# Patient Record
Sex: Male | Born: 1955 | Race: Black or African American | Hispanic: No | Marital: Married | Smoking: Never smoker
Health system: Southern US, Community
[De-identification: ages and names within clinical notes are randomized; demographics above are authoritative.]

## PROBLEM LIST (undated history)

## (undated) DIAGNOSIS — I82409 Acute embolism and thrombosis of unspecified deep veins of unspecified lower extremity: Secondary | ICD-10-CM

## (undated) DIAGNOSIS — Z8619 Personal history of other infectious and parasitic diseases: Secondary | ICD-10-CM

## (undated) HISTORY — DX: Personal history of other infectious and parasitic diseases: Z86.19

## (undated) HISTORY — PX: OTHER SURGICAL HISTORY: SHX169

---

## 2003-11-26 ENCOUNTER — Inpatient Hospital Stay (HOSPITAL_COMMUNITY): Admission: EM | Admit: 2003-11-26 | Discharge: 2003-11-30 | Payer: Self-pay

## 2011-08-15 ENCOUNTER — Encounter: Payer: Self-pay | Admitting: Internal Medicine

## 2011-08-15 ENCOUNTER — Ambulatory Visit (INDEPENDENT_AMBULATORY_CARE_PROVIDER_SITE_OTHER): Payer: BC Managed Care – PPO | Admitting: Internal Medicine

## 2011-08-15 VITALS — BP 110/80 | HR 70 | Temp 98.7°F | Resp 16 | Ht 74.0 in | Wt 206.0 lb

## 2011-08-15 DIAGNOSIS — Z23 Encounter for immunization: Secondary | ICD-10-CM

## 2011-08-15 DIAGNOSIS — Z412 Encounter for routine and ritual male circumcision: Secondary | ICD-10-CM

## 2011-08-15 DIAGNOSIS — Z0000 Encounter for general adult medical examination without abnormal findings: Secondary | ICD-10-CM

## 2011-08-15 LAB — CBC WITH DIFFERENTIAL/PLATELET
Basophils Absolute: 0 10*3/uL (ref 0.0–0.1)
Eosinophils Absolute: 0.1 10*3/uL (ref 0.0–0.7)
Hemoglobin: 12.3 g/dL — ABNORMAL LOW (ref 13.0–17.0)
Lymphocytes Relative: 39.8 % (ref 12.0–46.0)
MCHC: 32 g/dL (ref 30.0–36.0)
MCV: 94.7 fl (ref 78.0–100.0)
Monocytes Absolute: 0.4 10*3/uL (ref 0.1–1.0)
Monocytes Relative: 12.2 % — ABNORMAL HIGH (ref 3.0–12.0)
Platelets: 120 10*3/uL — ABNORMAL LOW (ref 150.0–400.0)
RBC: 4.06 Mil/uL — ABNORMAL LOW (ref 4.22–5.81)
WBC: 3 10*3/uL — ABNORMAL LOW (ref 4.5–10.5)

## 2011-08-15 LAB — COMPREHENSIVE METABOLIC PANEL
Alkaline Phosphatase: 56 U/L (ref 39–117)
CO2: 26 mEq/L (ref 19–32)
Calcium: 8.9 mg/dL (ref 8.4–10.5)
GFR: 78.95 mL/min (ref >60.00)
Sodium: 141 mEq/L (ref 135–145)
Total Bilirubin: 0.5 mg/dL (ref 0.3–1.2)

## 2011-08-15 LAB — LIPID PANEL
Cholesterol: 150 mg/dL (ref 0–200)
LDL Cholesterol: 93 mg/dL (ref 0–99)
Total CHOL/HDL Ratio: 3
Triglycerides: 55 mg/dL (ref 0.0–149.0)

## 2011-08-15 NOTE — Progress Notes (Signed)
  Subjective:    Patient ID: Troy Gonzalez, male    DOB: August 13, 1956, 55 y.o.   MRN: [id number]  HPI 54 year old patient who is seen today to establish with our practice. He has enjoyed remarkably good health and has had no prior hospital admissions. He has no chronic illnesses and takes no chronic medications. He wishes a urologic referral for consideration of a circumcision. No prior colonoscopies  Family history father died in his 35s unclear cause Mother died age 63 apparently surgical complications of a total hip replacement she died during surgery not postop; history of asthma 2 brothers deceased one an accidental death one died age 76 unclear causes 5 sisters positive for chronic kidney disease    Review of Systems  Constitutional: Negative for fever, chills, activity change, appetite change and fatigue.  HENT: Negative for hearing loss, ear pain, congestion, rhinorrhea, sneezing, mouth sores, trouble swallowing, neck pain, neck stiffness, dental problem, voice change, sinus pressure and tinnitus.   Eyes: Negative for photophobia, pain, redness and visual disturbance.  Respiratory: Negative for apnea, cough, choking, chest tightness, shortness of breath and wheezing.   Cardiovascular: Negative for chest pain, palpitations and leg swelling.  Gastrointestinal: Negative for nausea, vomiting, abdominal pain, diarrhea, constipation, blood in stool, abdominal distention, anal bleeding and rectal pain.  Genitourinary: Positive for difficulty urinating. Negative for dysuria, urgency, frequency, hematuria, flank pain, decreased urine volume, discharge, penile swelling, scrotal swelling, genital sores and testicular pain.  Musculoskeletal: Negative for myalgias, back pain, joint swelling, arthralgias and gait problem.  Skin: Negative for color change, rash and wound.  Neurological: Negative for dizziness, tremors, seizures, syncope, facial asymmetry, speech difficulty, weakness,  light-headedness, numbness and headaches.  Hematological: Negative for adenopathy. Does not bruise/bleed easily.  Psychiatric/Behavioral: Negative for suicidal ideas, hallucinations, behavioral problems, confusion, sleep disturbance, self-injury, dysphoric mood, decreased concentration and agitation. The patient is not nervous/anxious.        Objective:   Physical Exam  Constitutional: He appears well-developed and well-nourished.  HENT:  Head: Normocephalic and atraumatic.  Right Ear: External ear normal.  Left Ear: External ear normal.  Nose: Nose normal.  Mouth/Throat: Oropharynx is clear and moist.  Eyes: Conjunctivae and EOM are normal. Pupils are equal, round, and reactive to light. No scleral icterus.  Neck: Normal range of motion. Neck supple. No JVD present. No thyromegaly present.  Cardiovascular: Regular rhythm, normal heart sounds and intact distal pulses.  Exam reveals no gallop and no friction rub.   No murmur heard. Pulmonary/Chest: Effort normal and breath sounds normal. He exhibits no tenderness.  Abdominal: Soft. Bowel sounds are normal. He exhibits no distension and no mass. There is no tenderness.  Genitourinary: Prostate normal and penis normal. Guaiac negative stool.       Uncircumcised  Musculoskeletal: Normal range of motion. He exhibits no edema and no tenderness.  Lymphadenopathy:    He has no cervical adenopathy.  Neurological: He is alert. He has normal reflexes. No cranial nerve deficit. Coordination normal.  Skin: Skin is warm and dry. No rash noted.  Psychiatric: He has a normal mood and affect. His behavior is normal.          Assessment & Plan:   preventive health examination-  will set up for urological evaluation. The patient wishes to consider a circumcision   New screening colonoscopy and updated lab  Recheck one year

## 2011-08-15 NOTE — Patient Instructions (Signed)
Limit your sodium (Salt) intake    It is important that you exercise regularly, at least 20 minutes 3 to 4 times per week.  If you develop chest pain or shortness of breath seek  medical attention.  Return in one year for follow-up  Schedule your colonoscopy to help detect colon cancer. 

## 2011-09-04 ENCOUNTER — Other Ambulatory Visit: Payer: Self-pay | Admitting: Urology

## 2011-09-20 ENCOUNTER — Encounter (HOSPITAL_BASED_OUTPATIENT_CLINIC_OR_DEPARTMENT_OTHER): Payer: Self-pay | Admitting: *Deleted

## 2011-09-20 NOTE — Progress Notes (Signed)
To [facility] at 0730.Hg,Ekg on arrival.npo after mn

## 2011-09-22 ENCOUNTER — Encounter (HOSPITAL_BASED_OUTPATIENT_CLINIC_OR_DEPARTMENT_OTHER)
Admission: RE | Disposition: A | Discharge status: Home / Self Care | Payer: Self-pay | Source: Ambulatory Visit | Attending: Urology

## 2011-09-22 ENCOUNTER — Encounter (HOSPITAL_BASED_OUTPATIENT_CLINIC_OR_DEPARTMENT_OTHER): Payer: Self-pay | Admitting: Anesthesiology

## 2011-09-22 ENCOUNTER — Ambulatory Visit (HOSPITAL_BASED_OUTPATIENT_CLINIC_OR_DEPARTMENT_OTHER): Payer: BC Managed Care – PPO | Admitting: Anesthesiology

## 2011-09-22 ENCOUNTER — Encounter (HOSPITAL_BASED_OUTPATIENT_CLINIC_OR_DEPARTMENT_OTHER): Payer: Self-pay | Admitting: *Deleted

## 2011-09-22 ENCOUNTER — Ambulatory Visit (HOSPITAL_BASED_OUTPATIENT_CLINIC_OR_DEPARTMENT_OTHER)
Admission: RE | Admit: 2011-09-22 | Discharge: 2011-09-22 | Disposition: A | Discharge status: Home / Self Care | Payer: BC Managed Care – PPO | Source: Ambulatory Visit | Attending: Urology | Admitting: Urology

## 2011-09-22 ENCOUNTER — Other Ambulatory Visit: Payer: Self-pay

## 2011-09-22 DIAGNOSIS — N476 Balanoposthitis: Secondary | ICD-10-CM | POA: Insufficient documentation

## 2011-09-22 DIAGNOSIS — N471 Phimosis: Secondary | ICD-10-CM | POA: Insufficient documentation

## 2011-09-22 HISTORY — PX: CIRCUMCISION: SHX1350

## 2011-09-22 LAB — POCT HEMOGLOBIN-HEMACUE: Hemoglobin: 13.7 g/dL (ref 13.0–17.0)

## 2011-09-22 SURGERY — CIRCUMCISION, ADULT
Anesthesia: General | Site: Penis | Wound class: Clean

## 2011-09-22 MED ORDER — PROMETHAZINE HCL 25 MG/ML IJ SOLN: 6.2500 mg | INTRAMUSCULAR | Status: DC | PRN

## 2011-09-22 MED ORDER — DEXAMETHASONE SODIUM PHOSPHATE 4 MG/ML IJ SOLN
INTRAMUSCULAR | Status: DC | PRN
  Administered 2011-09-22: 10 mg via INTRAVENOUS

## 2011-09-22 MED ORDER — ONDANSETRON HCL 4 MG/2ML IJ SOLN
INTRAMUSCULAR | Status: DC | PRN
  Administered 2011-09-22: 4 mg via INTRAVENOUS

## 2011-09-22 MED ORDER — LACTATED RINGERS IV SOLN: INTRAVENOUS | Status: DC

## 2011-09-22 MED ORDER — FENTANYL CITRATE 0.05 MG/ML IJ SOLN
INTRAMUSCULAR | Status: DC | PRN
  Administered 2011-09-22: 75 ug via INTRAVENOUS
  Administered 2011-09-22: 25 ug via INTRAVENOUS

## 2011-09-22 MED ORDER — LIDOCAINE HCL (CARDIAC) 20 MG/ML IV SOLN
INTRAVENOUS | Status: DC | PRN
  Administered 2011-09-22: 100 mg via INTRAVENOUS

## 2011-09-22 MED ORDER — EPHEDRINE SULFATE 50 MG/ML IJ SOLN
INTRAMUSCULAR | Status: DC | PRN
  Administered 2011-09-22 (×2): 5 mg via INTRAVENOUS

## 2011-09-22 MED ORDER — PROPOFOL 10 MG/ML IV EMUL
INTRAVENOUS | Status: DC | PRN
  Administered 2011-09-22: 200 mg via INTRAVENOUS

## 2011-09-22 MED ORDER — MINERAL OIL LIGHT 100 % EX OIL
TOPICAL_OIL | CUTANEOUS | Status: DC | PRN
Start: 2011-09-22 — End: 2011-09-22
  Administered 2011-09-22: 1 via TOPICAL

## 2011-09-22 MED ORDER — HYDROCODONE-ACETAMINOPHEN 5-500 MG PO CAPS: 1.0000 | ORAL_CAPSULE | Freq: Four times a day (QID) | ORAL | Status: AC | PRN

## 2011-09-22 MED ORDER — BACITRACIN-NEOMYCIN-POLYMYXIN 400-5-5000 EX OINT
TOPICAL_OINTMENT | CUTANEOUS | Status: DC | PRN
  Administered 2011-09-22: 1 via TOPICAL

## 2011-09-22 MED ORDER — LACTATED RINGERS IV SOLN
INTRAVENOUS | Status: DC
  Administered 2011-09-22: 09:00:00 via INTRAVENOUS
  Administered 2011-09-22: 100 mL/h via INTRAVENOUS

## 2011-09-22 MED ORDER — MIDAZOLAM HCL 5 MG/5ML IJ SOLN
INTRAMUSCULAR | Status: DC | PRN
  Administered 2011-09-22: 2 mg via INTRAVENOUS

## 2011-09-22 MED ORDER — FENTANYL CITRATE 0.05 MG/ML IJ SOLN: 25.0000 ug | INTRAMUSCULAR | Status: DC | PRN

## 2011-09-22 MED ORDER — BUPIVACAINE HCL (PF) 0.25 % IJ SOLN
INTRAMUSCULAR | Status: DC | PRN
  Administered 2011-09-22: 10 mL

## 2011-09-22 SURGICAL SUPPLY — 29 items
BANDAGE CO FLEX L/F 1IN X 5YD (GAUZE/BANDAGES/DRESSINGS) IMPLANT
BANDAGE COBAN STERILE 3 (GAUZE/BANDAGES/DRESSINGS) ×1 IMPLANT
BANDAGE CONFORM 2  STR LF (GAUZE/BANDAGES/DRESSINGS) ×1 IMPLANT
BLADE SURG 15 STRL LF DISP TIS (BLADE) ×1 IMPLANT
BLADE SURG 15 STRL SS (BLADE) ×2
CLOTH BEACON ORANGE TIMEOUT ST (SAFETY) ×2 IMPLANT
COVER MAYO STAND STRL (DRAPES) ×2 IMPLANT
COVER TABLE BACK 60X90 (DRAPES) ×2 IMPLANT
DRAPE PED LAPAROTOMY (DRAPES) ×2 IMPLANT
ELECT REM PT RETURN 9FT ADLT (ELECTROSURGICAL) ×2
ELECTRODE REM PT RTRN 9FT ADLT (ELECTROSURGICAL) ×1 IMPLANT
GAUZE VASELINE 1X8 (GAUZE/BANDAGES/DRESSINGS) ×1 IMPLANT
GLOVE BIO SURGEON STRL SZ7 (GLOVE) ×2 IMPLANT
GLOVE BIOGEL M 6.5 STRL (GLOVE) ×1 IMPLANT
GLOVE ECLIPSE 6.0 STRL STRAW (GLOVE) ×1 IMPLANT
GLOVE SS BIOGEL STRL SZ 6 (GLOVE) IMPLANT
GLOVE SUPERSENSE BIOGEL SZ 6 (GLOVE) ×1
GOWN W/2 COTTON TOWELS 2 STD (GOWNS) ×2 IMPLANT
NDL HYPO 25X1 1.5 SAFETY (NEEDLE) ×1 IMPLANT
NEEDLE HYPO 25X1 1.5 SAFETY (NEEDLE) ×2 IMPLANT
PACK BASIN DAY SURGERY FS (CUSTOM PROCEDURE TRAY) ×2 IMPLANT
PENCIL BUTTON HOLSTER BLD 10FT (ELECTRODE) ×2 IMPLANT
SPONGE GAUZE 4X4 12PLY (GAUZE/BANDAGES/DRESSINGS) ×1 IMPLANT
SUT CHROMIC 4 0 P 3 18 (SUTURE) IMPLANT
SUT CHROMIC 5 0 RB 1 27 (SUTURE) IMPLANT
SYR CONTROL 10ML LL (SYRINGE) ×2 IMPLANT
TOWEL OR 17X24 6PK STRL BLUE (TOWEL DISPOSABLE) ×4 IMPLANT
TRAY DSU PREP LF (CUSTOM PROCEDURE TRAY) ×2 IMPLANT
WATER STERILE IRR 500ML POUR (IV SOLUTION) ×2 IMPLANT

## 2011-09-22 NOTE — Transfer of Care (Signed)
Immediate Anesthesia Transfer of Care Note  Patient: Troy Gonzalez  Procedure(s) Performed:  CIRCUMCISION ADULT  Patient Location: PACU  Anesthesia Type: General  Level of Consciousness: patient drowsy. Arouses to name, follows commands.  Airway & Oxygen Therapy: Patient Spontanous Breathing and Patient connected to face mask oxygen  Post-op Assessment: Report given to PACU RN and Post -op Vital signs reviewed and stable  Post vital signs: Reviewed and stable  Complications: No apparent anesthesia complications

## 2011-09-22 NOTE — Anesthesia Procedure Notes (Signed)
Procedure Name: LMA Insertion Date/Time: 09/22/2011 9:00 AM Performed by: Huel Coventry Pre-anesthesia Checklist: Patient identified, Emergency Drugs available, Suction available and Patient being monitored Patient Re-evaluated:Patient Re-evaluated prior to inductionOxygen Delivery Method: Circle System Utilized Preoxygenation: Pre-oxygenation with 100% oxygen Intubation Type: IV induction Ventilation: Mask ventilation without difficulty LMA: LMA with gastric port inserted LMA Size: 5.0 Number of attempts: 1 Placement Confirmation: positive ETCO2 Tube secured with: Tape Dental Injury: Teeth and Oropharynx as per pre-operative assessment

## 2011-09-22 NOTE — Progress Notes (Signed)
Dentures returned to patient. 

## 2011-09-22 NOTE — Op Note (Signed)
SHERWOOD CASTILLA is a 55 y.o.   09/22/2011  General  Surgeon: Wendie Simmer. Martie Muhlbauer   Indication: Patient is a 56 years old male who has been complaining of difficulty retracting his foreskin. He has been having multiple breakdowns of the foreskin. He was found on physical examination to have a severe phimosis and balanitis. He is scheduled today for circumcision.  Procedure: The patient was identified by his wrist band and proper timeout was taken.  Under general anesthesia he was prepped and draped and placed in the supine position. A penile block was done with 0.25% Marcaine. Then a dorsal and ventral slits were made on the foreskin and the redundant foreskin was excised. Hemostasis was secured with electrocautery. Skin approximation was then done with #3-0 chromic. Sterile  dressing was then applied to the incision.  The patient tolerated the procedure well and left the OR in satisfactory condition to postanesthesia care unit.  EBL: Minimal.

## 2011-09-22 NOTE — Anesthesia Postprocedure Evaluation (Signed)
  Anesthesia Post-op Note  Patient: Troy Gonzalez  Procedure(s) Performed:  CIRCUMCISION ADULT  Patient Location: PACU  Anesthesia Type: General  Level of Consciousness: awake and alert   Airway and Oxygen Therapy: Patient Spontanous Breathing  Post-op Pain: mild  Post-op Assessment: Post-op Vital signs reviewed, Patient's Cardiovascular Status Stable, Respiratory Function Stable, Patent Airway and No signs of Nausea or vomiting  Post-op Vital Signs: stable  Complications: No apparent anesthesia complications

## 2011-09-22 NOTE — H&P (Signed)
   History of Present Illness  Troy Gonzalez is seen today at Dr Waynette Buttery request.  He has been complaining of difficulty retracting his foreskin for several months.  It is now getting worse.  He can not retract the foreskin and has been nhaving fissures on the foreskin.  The urinary stream goes everywhere.  He has decreased stream.   Current Meds 1. No Reported Medications  Allergies No Known Allergies  1. No Known Allergies  Family History Problems  1. Family history of  Father Deceased At Age ____ 2. Family history of  Mother Deceased At Age ____  Social History Problems    Caffeine Use   Marital History - Currently Married   Never A Smoker   Occupation: Denied    History of  Alcohol Use  Review of Systems Genitourinary, constitutional, skin, eye, otolaryngeal, hematologic/lymphatic, cardiovascular, pulmonary, endocrine, musculoskeletal, gastrointestinal, neurological and psychiatric system(s) were reviewed and pertinent findings if present are noted.  Genitourinary: penile pain.    Vitals Vital Signs [Data Includes: Last 1 Day]  10Dec2012 01:04PM  BMI Calculated: 26.44 BSA Calculated: 2.2 Height: 6 ft 2 in Weight: 206 lb  Blood Pressure: 137 / 82, LUE, Sitting Temperature: 98.2 F, Oral Heart Rate: 67  Physical Exam Constitutional: Well nourished and well developed . No acute distress.  ENT:. The ears and nose are normal in appearance.  Neck: The appearance of the neck is normal and no neck mass is present.  Pulmonary: No respiratory distress and normal respiratory rhythm and effort.  Cardiovascular: Heart rate and rhythm are normal . No peripheral edema.  Abdomen: The abdomen is soft and nontender. No masses are palpated. No CVA tenderness. No hernias are palpable. No hepatosplenomegaly noted.  Genitourinary: Examination of the penis demonstrates phimosis and balanitis. The penis is uncircumcised. The scrotum is normal in appearance. Examination of the  right scrotum demonstrates no hydrocele. Examination of the left scrotum demostrates no hydrocele. The right epididymis is palpably normal. The left epididymis is palpably normal. The right spermatic cord is palpably normal. The left spermatic cord is palpably normal. The right testis is palpably normal. The left testis is normal.  Lymphatics: The femoral and inguinal nodes are not enlarged or tender.  Skin: Normal skin turgor, no visible rash and no visible skin lesions.  Neuro/Psych:. Mood and affect are appropriate.    Results/Data Urine [Data Includes: Last 1 Day]  10Dec2012  COLOR: YELLOW  Reference Range YELLOW APPEARANCE: CLEAR  Reference Range CLEAR SPECIFIC GRAVITY: 1.020  Reference Range 1.005-1.030 pH: 6.0  Reference Range 5.0-8.0 GLUCOSE: NEG mg/dL Reference Range NEG BILIRUBIN: NEG  Reference Range NEG KETONE: NEG mg/dL Reference Range NEG BLOOD: NEG  Reference Range NEG PROTEIN: NEG mg/dL Reference Range NEG UROBILINOGEN: 0.2 mg/dL Reference Range 1.6-1.0 NITRITE: NEG  Reference Range NEG LEUKOCYTE ESTERASE: NEG  Reference Range NEG  Assessment Assessed  1. Phimosis 605 2. Balanitis 607.1  Plan Health Maintenance (V70.0)  1. UA With REFLEX  Done: 10Dec2012 12:42PM Phimosis (605)  2. Follow-up Schedule Surgery Office  Follow-up  Done: 10Dec2012   Patient needs circumcision.  The procedure, risks, benefits were explained to him.  The risks include but are not limited to hemorrhage, infection, skin loss requiring grafting.  He understands and wishes to proceed.    Troy Gonzalez 09/22/2011, 8:41 AM

## 2011-09-22 NOTE — Anesthesia Preprocedure Evaluation (Addendum)
Anesthesia Evaluation  Patient identified by MRN, date of birth, ID band Patient awake    Reviewed: Allergy & Precautions, H&P , NPO status , Patient's Chart, lab work & pertinent test results  Airway Mallampati: II TM Distance: >3 FB Neck ROM: full    Dental  (+) Edentulous Upper and Edentulous Lower   Pulmonary neg pulmonary ROS,  clear to auscultation  Pulmonary exam normal       Cardiovascular Exercise Tolerance: Good neg cardio ROS regular Normal    Neuro/Psych Negative Neurological ROS  Negative Psych ROS   GI/Hepatic negative GI ROS, Neg liver ROS,   Endo/Other  Negative Endocrine ROS  Renal/GU negative Renal ROS  Genitourinary negative   Musculoskeletal   Abdominal   Peds  Hematology negative hematology ROS (+)   Anesthesia Other Findings   Reproductive/Obstetrics negative OB ROS                          Anesthesia Physical Anesthesia Plan  ASA: I  Anesthesia Plan: General   Post-op Pain Management:    Induction: Intravenous  Airway Management Planned: LMA  Additional Equipment:   Intra-op Plan:   Post-operative Plan:   Informed Consent: I have reviewed the patients History and Physical, chart, labs and discussed the procedure including the risks, benefits and alternatives for the proposed anesthesia with the patient or authorized representative who has indicated his/her understanding and acceptance.   Dental Advisory Given  Plan Discussed with: CRNA and Surgeon  Anesthesia Plan Comments:         Anesthesia Quick Evaluation

## 2011-09-25 ENCOUNTER — Encounter (HOSPITAL_BASED_OUTPATIENT_CLINIC_OR_DEPARTMENT_OTHER): Payer: Self-pay | Admitting: Urology

## 2013-08-29 ENCOUNTER — Emergency Department (HOSPITAL_COMMUNITY)
Admission: EM | Admit: 2013-08-29 | Discharge: 2013-08-29 | Disposition: A | Discharge status: Home / Self Care | Payer: BC Managed Care – PPO | Source: Home / Self Care | Attending: Emergency Medicine | Admitting: Emergency Medicine

## 2013-08-29 ENCOUNTER — Encounter (HOSPITAL_COMMUNITY): Payer: Self-pay | Admitting: Emergency Medicine

## 2013-08-29 DIAGNOSIS — J208 Acute bronchitis due to other specified organisms: Secondary | ICD-10-CM

## 2013-08-29 DIAGNOSIS — J209 Acute bronchitis, unspecified: Secondary | ICD-10-CM

## 2013-08-29 MED ORDER — ALBUTEROL SULFATE HFA 108 (90 BASE) MCG/ACT IN AERS: 1.0000 | INHALATION_SPRAY | Freq: Four times a day (QID) | RESPIRATORY_TRACT | Status: DC | PRN

## 2013-08-29 MED ORDER — HYDROCOD POLST-CHLORPHEN POLST 10-8 MG/5ML PO LQCR: 5.0000 mL | Freq: Two times a day (BID) | ORAL | Status: DC | PRN

## 2013-08-29 NOTE — Discharge Instructions (Signed)
Most upper respiratory infections are caused by viruses and do not require antibiotics.  We try to save the antibiotics for when we really need them to avoid resistance.  This does not mean that there is nothing that can be done.  Here are a few hints about things that can be done at home to get over an upper respiratory infection quicker:  Get extra sleep and extra fluids.  Get 7 to 9 hours of sleep per night and 6 to 8 glasses of water a day.  Getting extra sleep keeps the immune system from getting run down.  Most people with an upper respiratory infection are a little dehydrated.  The extra fluids also keep the secretions liquified and easier to deal with.  Also, get extra vitamin C.  4000 mg per day is the recommended dose. For the aches, headache, and fever, acetaminophen or ibuprofen are helpful.  These can be alternated every 4 hours.  People with liver disease should avoid large amounts of acetaminophen, and people with ulcer disease, gastroesophageal reflux, gastritis, congestive heart failure, chronic kidney disease, coronary artery disease and the elderly should avoid ibuprofen. For nasal congestion try Mucinex-D, or if you're having lots of sneezing or copious clear nasal drainage Allegra-D-24 hour.  A Saline nasal spray such as Ocean Spray can also help as can decongestant sprays such as Afrin, but you should not use the decongestant sprays for more than 3 or 4 days since they can be habituating.  If nasal dryness is a problem, Ayr Nasal Gel can help moisturize your nasal passages.  Breath Rite nasal strips can also offer a non-drug alternative treatment to nasal congestion, especially at night. For people with symptoms of sinusitis, sleeping with your head elevated can be helpful.  For sinus pain, moist, hot compresses to the face may provide some relief.  Many people find that inhaling steam as in a shower or from a pot of steaming water can help. For sore throat, zinc containing lozenges such  as Cold-Eze or Zicam are helpful.  Zinc helps to fight infection and has a mild astringent effect that relieves the sore, achey throat.  Hot salt water gargles (8 oz of hot water, 1/2 tsp of table salt, and a pinch of baking soda) can give relief as well as hot beverages such as hot tea. For the cough, old time remedies such as honey or honey and lemon are tried and true.  Over the counter cough syrups such as Delsym 2 tsp every 12 hours can help as well.  It has also been found recently that Aleve can help control a cough.  The dose is 1 to 2 tablets twice daily with food.  This can be combined with Delsym. (Note, if you are taking ibuprofen, you should not take Aleve as well--take one or the other.)  It's important when you have an upper respiratory infection not to pass the infection to others.  This involves being very careful about the following:  Frequent hand washing or use of hand sanitizer, especially after coughing, sneezing, blowing your nose or touching your face, nose or eyes. Do not shake hands or touch anyone and try to avoid touching surfaces that other people use such as doorknobs, shopping carts, telephones and computer keyboards. Use tissues and dispose of them properly in a garbage can or ziplock bag. Cough into your sleeve. Do not let others eat or drink after you.  It's also important to recognize the signs of serious illness and  get evaluated if they occur: Any respiratory infection that lasts more than 7 to 10 days.  Yellow nasal drainage and sputum are not reliable indicators of a bacterial infection, but if they last for more than 1 week, see your doctor. Fever and sore throat can indicate strep. Fever and cough can indicate influenza or pneumonia. Any kind of severe symptom such as difficulty breathing, intractable vomiting, or severe pain should prompt you to see a doctor as soon as possible.   Your body's immune system is really the thing that will get rid of this  infection.  Your immune system is comprised of 2 types of specialized cells called T cells and B cells.  T cells coordinate the array of cells in your body that engulf invading bacteria or viruses while B cells orchestrate the production of antibodies that neutralize infection.  Anything we do or any medications we give you, will just strengthen your immune system or help it clear up the infection quicker.  Here are a few helpful hints to improve your immune system to help overcome this illness or to prevent future infections:  A few vitamins can improve the health of your immune system.  That's why your diet should include plenty of fruits, vegetables, fish, nuts, and whole grains.  Vitamin A and bet-carotene can increase the cells that fight infections (T cells and B cells).  Vitamin A is abundant in dark greens and orange vegetables such as spinach, greens, sweet potatoes, and carrots.  Vitamin B6 contributes to the maturation of white blood cells, the cells that fight disease.  Foods with vitamin B6 include cold cereal and bananas.  Vitamin C is credited with preventing colds because it increases white blood cells and also prevents cellular damage.  Citrus fruits, peaches and green and red bell peppers are all hight in vitamin C.  Vitamin E is an anti-oxidant that encourages the production of natural killer cells which reject foreign invaders and B cells that produce antibodies.  Foods high in vitamin E include wheat germ, nuts and seeds.  Foods high in omega-3 fatty acids found in foods like salmon, tuna and mackerel boost your immune system and help cells to engulf and absorb germs.  Probiotics are good bacteria that increase your T cells.  These can be found in yogurt and are available in supplements such as Culturelle or Align.  Moderate exercise increases the strength of your immune system and your ability to recover from illness.  I suggest 3 to 5 moderate intensity 30 minute workouts per  week.    Sleep is another component of maintaining a strong immune system.  It enables your body to recuperate from the day's activities, stress and work.  My recommendation is to get between 7 and 9 hours of sleep per night.  If you smoke, try to quit completely or at least cut down.  Drink alcohol only in moderation if at all.  No more than 2 drinks daily for men or 1 for women.  Get a flu vaccine early in the fall or if you have not gotten one yet, once this illness has run its course.  If you are over 65, a smoker, or an asthmatic, get a pneumococcal vaccine.  My final recommendation is to maintain a healthy weight.  Excess weight can impair the immune system by interfering with the way the immune system deals with invading viruses or bacteria.  Using Your Inhaler Proper inhaler technique is very important. Good technique assures that  the medicine reaches the lungs. Poor technique results in depositing the medicine on the tongue and back of the throat rather than in the airways. STEPS TO FOLLOW IF USING INHALER WITHOUT EXTENSION TUBE: 1. Remove cap from inhaler. 2. Shake inhaler for 5 seconds before each inhalation (breathing in). 3. Position the inhaler so that the top of the canister faces up. 4. Put your index finger on the top of the medication canister. Your thumb supports the bottom of the inhaler. 5. Open your mouth. 6. Hold the inhaler 1 to 2 inches away from your open mouth. This allows the medicine to slow down before the medicine enters the mouth. 7. Exhale (breathe out) normally and as completely as possible. 8. Press the canister down with the index finger to release the medication. 9. At the same time as the canister is pressed, inhale deeply and slowly until the lungs are completely filled. This should take 4 to 6 seconds. Keep your tongue down and out of the way. 10. Hold the medication in your lungs for up to 10 seconds (10 seconds is best). This helps the medicine get into  the small airways of your lungs to work better. 11. Breathe out slowly, through pursed lips. Whistling is an example of pursed lips. 12. Wait at least 1 minute between puffs. Continue with the above steps until you have taken the number of puffs your caregiver has ordered. 13. Replace cap on inhaler. STEPS TO FOLLOW USING AN INHALER WITH AN EXTENSION (SPACER): 1.  Remove cap from inhaler. 2. Shake inhaler for 5 seconds before each inhalation (breathing in). 3. Your caregiver has asked you to use a spacer with your inhaler. A spacer is a plastic tube with a mouthpiece on one end and an opening that connects to the inhaler on the other end. A spacer helps you take the medicine better. 4. Place the open end of the spacer onto the mouthpiece of the inhaler. 5. Position the inhaler so that the top of the canister faces up and the spacer mouthpiece faces you. 6. Put your index finger on the top of the medication canister. Your thumb supports the bottom of the inhaler and the spacer. 7. Exhale (breathe out) normally and as completely as possible. 8. Immediately after exhaling, place the spacer between your teeth and into your mouth. Close your mouth tightly around the spacer. 9. Press the canister down with the index finger to release the medication. 10. At the same time as the canister is pressed, inhale deeply and slowly until the lungs are completely filled. This should take 4 to 6 seconds. Keep your tongue down and out of the way. 11. Hold the medication in your lungs for up to 10 seconds (10 seconds is best). This helps the medicine get into the small airways of your lungs to work better. Exhale. 12. Repeat inhaling deeply through the spacer mouthpiece. Again hold that breath for up to 10 seconds (10 seconds is best). Exhale slowly. If it is difficult to take this second deep breath through the spacer, breathe normally several times through the spacer. Remove the spacer from your mouth. 13. Wait at  least 1 minute between puffs. Continue with the above steps until you have taken the number of puffs your caregiver has ordered. 14. Remove spacer from the inhaler and place cap on inhaler. If you are using different kinds of inhalers, use your quick relief medicine to open the airways 10 - 15 minutes before using a steroid. If  you are unsure which inhalers to use and the order of using them, ask your caregiver, nurse, or respiratory therapist. If you are using a steroid inhaler, rinse your mouth with water after your last puff and then spit out the water. Do not swallow the water. Avoid the following:  Inhaling before or after starting the spray of medicine. It takes practice to coordinate your breathing with triggering the spray.  Inhaling through the nose (rather than the mouth) when triggering the spray. HOW TO DETERMINE IF YOUR INHALER IS FULL OR NEARLY EMPTY:  Determine when an inhaler is empty. You cannot know when an inhaler is empty by shaking it. A few inhalers are now being made with dose counters. Ask your caregiver for a prescription that has a dose counter if you feel you need that extra help.  If your inhaler does not have a counter, check the number of doses in the inhaler before you use it. The canister or box will list the number of doses in the canister. Divide the total number of doses in the canister by the number you will use each day to find how many days the canister will last. (For example, if your canister has 200 doses and you take 2 puffs, 4 times each day, which is 8 puffs a day. Dividing 200 by 8 equals 25. The canister should last 25 days.) Using a calendar, count forward that many days to see when your inhaler will run out. Write the refill date on a calendar or your canister.  Remember, if you need to take extra doses, the inhaler will empty sooner than you figured. Be sure you have a refill before your canister runs out. Refill your inhaler 7 to 10 days before it runs  out. HOME CARE INSTRUCTIONS   Do not use the inhaler more than your caregiver tells you. If you are still wheezing and are feeling tightness in your chest, call your caregiver.  Keep an adequate supply of medication. This includes making sure the medicine is not expired, and you have a spare inhaler.  Follow your caregiver or inhaler insert directions for cleaning the inhaler and spacer. SEEK MEDICAL CARE IF:   Symptoms are only partially relieved with your inhaler.  You are having trouble using your inhaler.  You experience some increase in phlegm.  You develop a fever of 100.5 F (38.1 C). SEEK IMMEDIATE MEDICAL CARE IF:   You feel little or no relief with your inhalers. You are still wheezing and are feeling shortness of breath and/or tightness in your chest.  You have side effects such as dizziness, headaches or fast heart rate.  You have chills, fever, night sweats or an oral temperature above 102 F (38.9 C).  Phlegm production increases a lot, or there is blood in the phlegm. MAKE SURE YOU:   Understand these instructions.  Will watch your condition.  Will get help right away if you are not doing well or get worse. Document Released: 09/08/2000 Document Revised: 12/04/2011 Document Reviewed: 06/29/2009 [facility] Patient Information 2014 [place], [place].   Bronchitis Bronchitis is the body's way of reacting to injury and/or infection (inflammation) of the bronchi. Bronchi are the air tubes that extend from the windpipe into the lungs. If the inflammation becomes severe, it may cause shortness of breath. CAUSES  Inflammation may be caused by:  A virus.  Germs (bacteria).  Dust.  Allergens.  Pollutants and many other irritants. The cells lining the bronchial tree are covered with tiny  hairs (cilia). These constantly beat upward, away from the lungs, toward the mouth. This keeps the lungs free of pollutants. When these cells become too irritated and are unable  to do their job, mucus begins to develop. This causes the characteristic cough of bronchitis. The cough clears the lungs when the cilia are unable to do their job. Without either of these protective mechanisms, the mucus would settle in the lungs. Then you would develop pneumonia. Smoking is a common cause of bronchitis and can contribute to pneumonia. Stopping this habit is the single most important thing you can do to help yourself. TREATMENT   Your caregiver may prescribe an antibiotic if the cough is caused by bacteria. Also, medicines that open up your airways make it easier to breathe. Your caregiver may also recommend or prescribe an expectorant. It will loosen the mucus to be coughed up. Only take over-the-counter or prescription medicines for pain, discomfort, or fever as directed by your caregiver.  Removing whatever causes the problem (smoking, for example) is critical to preventing the problem from getting worse.  Cough suppressants may be prescribed for relief of cough symptoms.  Inhaled medicines may be prescribed to help with symptoms now and to help prevent problems from returning.  For those with recurrent (chronic) bronchitis, there may be a need for steroid medicines. SEEK IMMEDIATE MEDICAL CARE IF:   During treatment, you develop more pus-like mucus (purulent sputum).  You have a fever.  You become progressively more ill.  You have increased difficulty breathing, wheezing, or shortness of breath. It is necessary to seek immediate medical care if you are elderly or sick from any other disease. MAKE SURE YOU:   Understand these instructions.  Will watch your condition.  Will get help right away if you are not doing well or get worse. Document Released: 09/11/2005 Document Revised: 05/14/2013 Document Reviewed: 05/06/2013 [facility] Patient Information 2014 [place], [place].

## 2013-08-29 NOTE — ED Notes (Signed)
Pt c/o cold sxs onset 1 week Sxs include: dry cough, HA Denies: f/v/n/d, runny nose, congestion Alert w/no signs of acute distress.

## 2013-08-29 NOTE — ED Provider Notes (Signed)
Chief Complaint:   Chief Complaint  Patient presents with  . URI    History of Present Illness:   Troy Gonzalez is a 57 year old male who's had a one-week history of URI symptoms with cough productive of white sputum, chest pain when he coughs, headache, has felt hot, has had nasal congestion. He denies any fever, chills, wheezing, chest tightness, rhinorrhea, sore throat, swollen glands, or stiff neck. He has had no known sick exposures.  Review of Systems:  Other than noted above, the patient denies any of the following symptoms: Systemic:  No fevers, chills, sweats, weight loss or gain, fatigue, or tiredness. Eye:  No redness or discharge. ENT:  No ear pain, drainage, headache, nasal congestion, drainage, sinus pressure, difficulty swallowing, or sore throat. Neck:  No neck pain or swollen glands. Lungs:  No cough, sputum production, hemoptysis, wheezing, chest tightness, shortness of breath or chest pain. GI:  No abdominal pain, nausea, vomiting or diarrhea.  PMFSH:  Past medical history, family history, social history, meds, and allergies were reviewed.  Physical Exam:   Vital signs:  BP 148/85  Pulse 72  Temp(Src) 98.9 F (37.2 C) (Oral)  Resp 16  SpO2 97% General:  Alert and oriented.  In no distress.  Skin warm and dry. Eye:  No conjunctival injection or drainage. Lids were normal. ENT:  TMs and canals were normal, without erythema or inflammation.  Nasal mucosa was clear and uncongested, without drainage.  Mucous membranes were moist.  Pharynx was clear with no exudate or drainage.  There were no oral ulcerations or lesions. Neck:  Supple, no adenopathy, tenderness or mass. Lungs:  No respiratory distress.  Lungs were clear to auscultation, without wheezes, rales or rhonchi.  Breath sounds were clear and equal bilaterally.  Heart:  Regular rhythm, without gallops, murmers or rubs. Skin:  Clear, warm, and dry, without rash or lesions.  Assessment:  The encounter diagnosis  was Viral bronchitis.  No indication for antibiotics.  Plan:   1.  Meds:  The following meds were prescribed:   Discharge Medication List as of 08/29/2013  1:26 PM    START taking these medications   Details  albuterol (PROVENTIL HFA;VENTOLIN HFA) 108 (90 BASE) MCG/ACT inhaler Inhale 1-2 puffs into the lungs every 6 (six) hours as needed for wheezing or shortness of breath., Starting 08/29/2013, Until Discontinued, Normal    chlorpheniramine-HYDROcodone (TUSSIONEX) 10-8 MG/5ML LQCR Take 5 mLs by mouth every 12 (twelve) hours as needed for cough., Starting 08/29/2013, Until Discontinued, Normal        2.  Patient Education/Counseling:  The patient was given appropriate handouts, self care instructions, and instructed in symptomatic relief.   3.  Follow up:  The patient was told to follow up if no better in 3 to 4 days, if becoming worse in any way, and given some red flag symptoms such as fever or difficulty breathing which would prompt immediate return.  Follow up here as needed.      Reuben Likes, MD 08/29/13 2216

## 2014-08-07 ENCOUNTER — Encounter (HOSPITAL_COMMUNITY): Payer: Self-pay | Admitting: Family Medicine

## 2014-08-07 ENCOUNTER — Emergency Department (HOSPITAL_COMMUNITY)
Admission: EM | Admit: 2014-08-07 | Discharge: 2014-08-07 | Disposition: A | Discharge status: Home / Self Care | Payer: BC Managed Care – PPO | Source: Home / Self Care | Attending: Family Medicine | Admitting: Family Medicine

## 2014-08-07 DIAGNOSIS — B9789 Other viral agents as the cause of diseases classified elsewhere: Principal | ICD-10-CM

## 2014-08-07 DIAGNOSIS — J069 Acute upper respiratory infection, unspecified: Secondary | ICD-10-CM

## 2014-08-07 MED ORDER — BENZONATATE 100 MG PO CAPS: 100.0000 mg | ORAL_CAPSULE | Freq: Three times a day (TID) | ORAL | Status: DC | PRN

## 2014-08-07 MED ORDER — IPRATROPIUM BROMIDE 0.06 % NA SOLN: 2.0000 | Freq: Four times a day (QID) | NASAL | Status: DC

## 2014-08-07 MED ORDER — HYDROCOD POLST-CHLORPHEN POLST 10-8 MG/5ML PO LQCR: 2.5000 mL | Freq: Two times a day (BID) | ORAL | Status: DC | PRN

## 2014-08-07 MED ORDER — FLUTICASONE PROPIONATE 50 MCG/ACT NA SUSP: 2.0000 | Freq: Every day | NASAL | Status: DC

## 2014-08-07 NOTE — Discharge Instructions (Signed)
You likely have a viral cold and post nasal drip causing your cough This may take another 2-3 weeks to go away Please start the atrovent and flonase to help with the post nasal drip You can also try the tesselon perles to help get rid of the cough Consider using ibuprofen 600mg  every 6 hours  Considier using Mucinex or Mucinex-D to help with the mucus production Use the cough medicine to help you sleep at night.  Please call back if you develop fevers, facial pain or shortness of breat as you will likely need antibiotics at that time.

## 2014-08-07 NOTE — ED Notes (Signed)
Pt has been suffering from nasal congestion and cough x1 week.  Pt denies any fever or sore throat.

## 2014-08-07 NOTE — ED Provider Notes (Signed)
CSN: [id number]     Arrival date & time 08/07/14  [id number] History   First MD Initiated Contact with Patient 08/07/14 [phone]     Chief Complaint  Patient presents with  . Nasal Congestion  . Cough   (Consider location/radiation/quality/duration/timing/severity/associated sxs/prior Treatment) HPI  Cough adn congestion x 7 days. Getting worse. Now w/ HA. Denies sinus pain or pressure, fevers, sore throat. Denies sick contacts. Tylenol cold w/o benefit. Worse at night.    Past Medical History  Diagnosis Date  . History of chicken pox    Past Surgical History  Procedure Laterality Date  . None    . Circumcision  09/22/2011    Procedure: CIRCUMCISION ADULT;  Surgeon: Lindaann SloughMarc-Henry Nesi, MD;  [facility] ;  Service: Urology;  Laterality: N/A;   Family History  Problem Relation Age of Onset  . Asthma Mother   . Kidney disease Sister   . Stroke Maternal Grandmother    History  Substance Use Topics  . Smoking status: Never Smoker   . Smokeless tobacco: Never Used  . Alcohol Use: No    Review of Systems Per HPI with all other pertinent systems negative.   Allergies  Review of patient's allergies indicates no known allergies.  Home Medications   Prior to Admission medications   Medication Sig Start Date End Date Taking? Authorizing Provider  albuterol (PROVENTIL HFA;VENTOLIN HFA) 108 (90 BASE) MCG/ACT inhaler Inhale 1-2 puffs into the lungs every 6 (six) hours as needed for wheezing or shortness of breath. 08/29/13   Reuben Likesavid C Keller, MD  benzonatate (TESSALON PERLES) 100 MG capsule Take 1 capsule (100 mg total) by mouth 3 (three) times daily as needed for cough. 08/07/14   Ozella Rocksavid J Giselle Brutus, MD  chlorpheniramine-HYDROcodone (TUSSIONEX) 10-8 MG/5ML LQCR Take 2.5-5 mLs by mouth every 12 (twelve) hours as needed for cough. 08/07/14   Ozella Rocksavid J Asma Boldon, MD  fluticasone (FLONASE) 50 MCG/ACT nasal spray Place 2 sprays into both nostrils at bedtime. 08/07/14   Ozella Rocksavid J  Niang Mitcheltree, MD  ipratropium (ATROVENT) 0.06 % nasal spray Place 2 sprays into both nostrils 4 (four) times daily. 08/07/14   Ozella Rocksavid J Winni Ehrhard, MD   BP 133/81 mmHg  Pulse 73  Temp(Src) 98.7 F (37.1 C) (Oral)  Resp 16  SpO2 96% Physical Exam  Constitutional: He is oriented to person, place, and time. He appears well-developed and well-nourished. No distress.  HENT:  Head: Normocephalic.  Minimal nasal congestion PHaryngeal cobblestoning Tonsils 0 No exudate or erythema.  No cervical lymphadenopathy Maxillary and frontal sinuses non-ttp  Eyes: Pupils are equal, round, and reactive to light.  Neck: Normal range of motion.  Cardiovascular: Normal rate.   No murmur heard. Pulmonary/Chest: Effort normal and breath sounds normal.  Abdominal: Soft. Bowel sounds are normal.  Musculoskeletal: Normal range of motion. He exhibits no edema or tenderness.  Neurological: He is alert and oriented to person, place, and time.  Skin: Skin is warm and dry. He is not diaphoretic.  Psychiatric: He has a normal mood and affect. His behavior is normal. Judgment and thought content normal.    ED Course  Procedures (including critical care time) Labs Review Labs Reviewed - No data to display  Imaging Review No results found.   MDM   1. Viral URI with cough    Fluids, rest, NSAIDs Rx sent for nasal atrovent, Flonase, small amount of tussionex, and tessalon perles. Consider Mucinex or Mucinex-D Discussed possibliity of this lasting for several more weeks Pt to  call for ABX if develops fevers, facial pain and sinus congestion, and SOB.   Precautions given and all questions answered   Shelly Flattenavid Aubry Rankin, MD Family Medicine 08/07/2014, 10:18 AM      Ozella Rocksavid J Andric Kerce, MD 08/07/14 1018

## 2014-09-22 ENCOUNTER — Encounter (HOSPITAL_COMMUNITY): Payer: Self-pay | Admitting: *Deleted

## 2014-09-22 ENCOUNTER — Emergency Department (HOSPITAL_COMMUNITY)
Admission: EM | Admit: 2014-09-22 | Discharge: 2014-09-22 | Disposition: A | Discharge status: Nursing Home (Includes ICF) | Payer: BC Managed Care – PPO | Source: Home / Self Care | Attending: Family Medicine | Admitting: Family Medicine

## 2014-09-22 ENCOUNTER — Emergency Department (HOSPITAL_COMMUNITY)
Admission: EM | Admit: 2014-09-22 | Discharge: 2014-09-22 | Disposition: A | Discharge status: Home / Self Care | Payer: BC Managed Care – PPO | Attending: Emergency Medicine | Admitting: Emergency Medicine

## 2014-09-22 ENCOUNTER — Emergency Department (HOSPITAL_COMMUNITY): Payer: BC Managed Care – PPO

## 2014-09-22 DIAGNOSIS — E860 Dehydration: Secondary | ICD-10-CM | POA: Diagnosis not present

## 2014-09-22 DIAGNOSIS — R5383 Other fatigue: Secondary | ICD-10-CM

## 2014-09-22 DIAGNOSIS — R5381 Other malaise: Secondary | ICD-10-CM

## 2014-09-22 DIAGNOSIS — R61 Generalized hyperhidrosis: Secondary | ICD-10-CM | POA: Diagnosis not present

## 2014-09-22 DIAGNOSIS — Z79899 Other long term (current) drug therapy: Secondary | ICD-10-CM | POA: Diagnosis not present

## 2014-09-22 DIAGNOSIS — R531 Weakness: Secondary | ICD-10-CM

## 2014-09-22 DIAGNOSIS — Z8619 Personal history of other infectious and parasitic diseases: Secondary | ICD-10-CM | POA: Insufficient documentation

## 2014-09-22 LAB — BASIC METABOLIC PANEL
Anion gap: 12 (ref 5–15)
BUN: 24 mg/dL — AB (ref 6–23)
CHLORIDE: 103 meq/L (ref 96–112)
CO2: 22 mmol/L (ref 19–32)
CREATININE: 1.45 mg/dL — AB (ref 0.50–1.35)
Calcium: 9.3 mg/dL (ref 8.4–10.5)
GFR calc Af Amer: 59 mL/min — ABNORMAL LOW (ref >90)
GFR calc non Af Amer: 51 mL/min — ABNORMAL LOW (ref >90)
GLUCOSE: 112 mg/dL — AB (ref 70–99)
POTASSIUM: 4.4 mmol/L (ref 3.5–5.1)
Sodium: 137 mmol/L (ref 135–145)

## 2014-09-22 LAB — CBC WITH DIFFERENTIAL/PLATELET
Basophils Absolute: 0 10*3/uL (ref 0.0–0.1)
Basophils Relative: 0 % (ref 0–1)
EOS ABS: 0.1 10*3/uL (ref 0.0–0.7)
Eosinophils Relative: 1 % (ref 0–5)
HEMATOCRIT: 40.1 % (ref 39.0–52.0)
HEMOGLOBIN: 13.5 g/dL (ref 13.0–17.0)
LYMPHS ABS: 1.1 10*3/uL (ref 0.7–4.0)
Lymphocytes Relative: 21 % (ref 12–46)
MCH: 29.7 pg (ref 26.0–34.0)
MCHC: 33.7 g/dL (ref 30.0–36.0)
MCV: 88.3 fL (ref 78.0–100.0)
MONO ABS: 0.4 10*3/uL (ref 0.1–1.0)
MONOS PCT: 8 % (ref 3–12)
NEUTROS ABS: 3.6 10*3/uL (ref 1.7–7.7)
NEUTROS PCT: 70 % (ref 43–77)
Platelets: 157 10*3/uL (ref 150–400)
RBC: 4.54 MIL/uL (ref 4.22–5.81)
RDW: 13.7 % (ref 11.5–15.5)
WBC: 5.2 10*3/uL (ref 4.0–10.5)

## 2014-09-22 LAB — URINALYSIS, ROUTINE W REFLEX MICROSCOPIC
Bilirubin Urine: NEGATIVE
GLUCOSE, UA: NEGATIVE mg/dL
HGB URINE DIPSTICK: NEGATIVE
Ketones, ur: 15 mg/dL — AB
Nitrite: NEGATIVE
Protein, ur: NEGATIVE mg/dL
SPECIFIC GRAVITY, URINE: 1.026 (ref 1.005–1.030)
Urobilinogen, UA: 0.2 mg/dL (ref 0.0–1.0)
pH: 5 (ref 5.0–8.0)

## 2014-09-22 LAB — URINE MICROSCOPIC-ADD ON

## 2014-09-22 LAB — I-STAT TROPONIN, ED: TROPONIN I, POC: 0 ng/mL (ref 0.00–0.08)

## 2014-09-22 MED ORDER — SODIUM CHLORIDE 0.9 % IV BOLUS (SEPSIS)
1000.0000 mL | Freq: Once | INTRAVENOUS | Status: AC
  Administered 2014-09-22: 1000 mL via INTRAVENOUS

## 2014-09-22 NOTE — ED Notes (Signed)
Pt denies use of a wheelchair and ambulates independently from ED escorted by this RN. Pt verbalizes understanding of d/c instructions.

## 2014-09-22 NOTE — ED Notes (Signed)
Pt  Has   Symptoms  Of  Weakness    And        Shortness  Of  Breath  On   Exertion  For  The  Last  6  Days     -  Pt  denys  Any  Chest  Pain        At  This  Time    however   Reports  last  Pm  Had  Some  Tightness  In  His  Chest  On  Exertion   He reports  Vague            Cramping  In  abd   And  Sides      At  Times       -  At  This  Time  Pt  Is  Awake  And  Alert  And  Oriented  Skin is  Warm  And  Dry   Speaking in  Complete   sentances

## 2014-09-22 NOTE — Discharge Instructions (Signed)
Call for a follow up appointment with a Family or Primary Care Provider, for repeat kidney functions test in 1 week. Drink plenty of fluids. Return if Symptoms worsen.   Take medication as prescribed.

## 2014-09-22 NOTE — ED Notes (Signed)
Patient back from xray,and placed back on monitor.

## 2014-09-22 NOTE — ED Provider Notes (Signed)
CSN: [id number]     Arrival date & time 09/22/14  [id number] History   First MD Initiated Contact with Patient 09/22/14 0945     Chief Complaint  Patient presents with  . Weakness     (Consider location/radiation/quality/duration/timing/severity/associated sxs/prior Treatment) HPI Comments: The patient is a 58 year old male presenting to the emergency department the chief complaint of weakness.  Patient reports while working his experience diaphoresis and some lightheadedness, resolved with rest. He reports approximately 4 episodes yesterday. He denies chest pain, cough, fever, palpitations, lower extremity edema, fever, nausea, vomiting, diarrhea or recent illness.   Patient is a 58 y.o. male presenting with weakness. The history is provided by the patient. No language interpreter was used.  Weakness Associated symptoms include diaphoresis and weakness. Pertinent negatives include no abdominal pain, chest pain, congestion, coughing, fever, nausea, sore throat or vomiting.    Past Medical History  Diagnosis Date  . History of chicken pox    Past Surgical History  Procedure Laterality Date  . None    . Circumcision  09/22/2011    Procedure: CIRCUMCISION ADULT;  Surgeon: Lindaann SloughMarc-Henry Nesi, MD;  [facility];  Service: Urology;  Laterality: N/A;   Family History  Problem Relation Age of Onset  . Asthma Mother   . Kidney disease Sister   . Stroke Maternal Grandmother    History  Substance Use Topics  . Smoking status: Never Smoker   . Smokeless tobacco: Never Used  . Alcohol Use: No    Review of Systems  Constitutional: Positive for diaphoresis. Negative for fever.  HENT: Negative for congestion and sore throat.   Respiratory: Negative for cough.   Cardiovascular: Negative for chest pain, palpitations and leg swelling.  Gastrointestinal: Negative for nausea, vomiting, abdominal pain, diarrhea, blood in stool and anal bleeding.  Genitourinary: Negative for  dysuria and hematuria.  Neurological: Positive for weakness and light-headedness. Negative for syncope.      Allergies  Review of patient's allergies indicates no known allergies.  Home Medications   Prior to Admission medications   Medication Sig Start Date End Date Taking? Authorizing Provider  albuterol (PROVENTIL HFA;VENTOLIN HFA) 108 (90 BASE) MCG/ACT inhaler Inhale 1-2 puffs into the lungs every 6 (six) hours as needed for wheezing or shortness of breath. 08/29/13  Yes Reuben Likesavid C Keller, MD  benzonatate (TESSALON PERLES) 100 MG capsule Take 1 capsule (100 mg total) by mouth 3 (three) times daily as needed for cough. Patient not taking: Reported on 09/22/2014 08/07/14   Ozella Rocksavid J Merrell, MD  chlorpheniramine-HYDROcodone (TUSSIONEX) 10-8 MG/5ML [facility] Take 2.5-5 mLs by mouth every 12 (twelve) hours as needed for cough. Patient not taking: Reported on 09/22/2014 08/07/14   Ozella Rocksavid J Merrell, MD  fluticasone [facility](FLONASE) 50 MCG/ACT nasal spray Place 2 sprays into both nostrils at bedtime. Patient not taking: Reported on 09/22/2014 08/07/14   Ozella Rocksavid J Merrell, MD  ipratropium (ATROVENT) 0.06 % nasal spray Place 2 sprays into both nostrils 4 (four) times daily. Patient not taking: Reported on 09/22/2014 08/07/14   Ozella Rocksavid J Merrell, MD   BP 127/73 mmHg  Pulse 88  Temp(Src) 98.2 F (36.8 C) (Oral)  Resp 20  Ht 6\' 2"  (1.88 m)  Wt 212 lb (96.163 kg)  BMI 27.21 kg/m2  SpO2 98% Physical Exam  Constitutional: He is oriented to person, place, and time. He appears well-developed and well-nourished.  Non-toxic appearance. He does not have a sickly appearance. He does not appear ill. No distress.  HENT:  Head:  Normocephalic and atraumatic.  Neck: Neck supple.  Cardiovascular: Normal rate and regular rhythm.   No murmur heard. No lower extremity edema.  Pulmonary/Chest: Effort normal. No respiratory distress. He has no wheezes. He has no rales. He exhibits no tenderness.  Abdominal: Soft. He exhibits  no distension. There is no tenderness. There is no rebound.  Musculoskeletal: Normal range of motion.  Neurological: He is oriented to person, place, and time.  Skin: Skin is warm and dry. He is not diaphoretic.  Psychiatric: He has a normal mood and affect. His behavior is normal.  Nursing note and vitals reviewed.   ED Course  Procedures (including critical care time) Labs Review Labs Reviewed  BASIC METABOLIC PANEL - Abnormal; Notable for the following:    Glucose, Bld 112 (*)    BUN 24 (*)    Creatinine, Ser 1.45 (*)    GFR calc non Af Amer 51 (*)    GFR calc Af Amer 59 (*)    All other components within normal limits  URINALYSIS, ROUTINE W REFLEX MICROSCOPIC - Abnormal; Notable for the following:    Color, Urine AMBER (*)    APPearance HAZY (*)    Ketones, ur 15 (*)    Leukocytes, UA TRACE (*)    All other components within normal limits  URINE MICROSCOPIC-ADD ON - Abnormal; Notable for the following:    Squamous Epithelial / LPF FEW (*)    Bacteria, UA FEW (*)    Casts HYALINE CASTS (*)    All other components within normal limits  CBC WITH DIFFERENTIAL  Rosezena SensorI-STAT TROPOININ, ED    Imaging Review Dg Chest 2 View  09/22/2014   CLINICAL DATA:  Dizziness and weakness for 1 day  EXAM: CHEST  2 VIEW  COMPARISON:  None.  FINDINGS: Lungs are clear. Heart size and pulmonary vascularity are normal. No adenopathy. No bone lesions. There is degenerative change in the thoracic spine.  IMPRESSION: No edema or consolidation.   Electronically Signed   By: Bretta BangWilliam  Woodruff M.D.   On: 09/22/2014 10:31     EKG Interpretation None      MDM   Final diagnoses:  Weakness  Dehydration   Patient presents with lightheadedness and chest discomfort with multiple episodes last night. Patient has been asymptomatic for greater than 12 hours. Patient reports persistent weakness. Denies infectious symptoms. Labs show elevated creatinine and BUN from 3 years ago, fluids given. UA shows  dehydration. Plan to treat with IV fluids. Reevaluation discussed lab results and treatment plan with the patient. Patient agrees. Reevaluation patient denies further symptoms. Discussed lab results, imaging results, and treatment plan with the patient. Return precautions given. Reports understanding and no other concerns at this time.  Patient is stable for discharge at this time. Encouraged follow-up with PCP for repeat kidney function test. Meds given in ED:  Medications  sodium chloride 0.9 % bolus 1,000 mL (0 mLs Intravenous Stopped 09/22/14 1245)    Discharge Medication List as of 09/22/2014  1:03 PM         Mellody DrownLauren Hagan Maltz, PA-C 09/22/14 1401  Flint MelterElliott L Wentz, MD 09/22/14 1650

## 2014-09-22 NOTE — ED Notes (Signed)
States several days before Christmas just wasn't feeling well states when he would get up some mornings would have pain in his chest however after moving around pain would go away.  [facility] last pm at work he would work for a while and have to rest. . Denies pain

## 2014-09-22 NOTE — ED Provider Notes (Addendum)
CSN: [id number]     Arrival date & time 09/22/14  0801 History   First MD Initiated Contact with Patient 09/22/14 [phone]     Chief Complaint  Patient presents with  . Weakness   (Consider location/radiation/quality/duration/timing/severity/associated sxs/prior Treatment) Patient is a 58 y.o. male presenting with weakness. The history is provided by the patient.  Weakness This is a new problem. The current episode started more than 2 days ago. The problem has been gradually worsening. Pertinent negatives include no chest pain and no abdominal pain. Associated symptoms comments: Diaphoresis with exertion,early satiety sx, weak and fatigued. Denies etoh or smoking hx., nl bm and urinary fxn..    Past Medical History  Diagnosis Date  . History of chicken pox    Past Surgical History  Procedure Laterality Date  . None    . Circumcision  09/22/2011    Procedure: CIRCUMCISION ADULT;  Surgeon: Lindaann SloughMarc-Henry Nesi, MD;  [facility];  Service: Urology;  Laterality: N/A;   Family History  Problem Relation Age of Onset  . Asthma Mother   . Kidney disease Sister   . Stroke Maternal Grandmother    History  Substance Use Topics  . Smoking status: Never Smoker   . Smokeless tobacco: Never Used  . Alcohol Use: No    Review of Systems  Constitutional: Positive for diaphoresis, activity change and appetite change.  Respiratory: Negative.   Cardiovascular: Negative for chest pain, palpitations and leg swelling.  Gastrointestinal: Negative for abdominal pain, diarrhea and constipation.  Musculoskeletal: Negative.   Neurological: Positive for weakness.    Allergies  Review of patient's allergies indicates no known allergies.  Home Medications   Prior to Admission medications   Medication Sig Start Date End Date Taking? Authorizing Provider  albuterol (PROVENTIL HFA;VENTOLIN HFA) 108 (90 BASE) MCG/ACT inhaler Inhale 1-2 puffs into the lungs every 6 (six) hours as  needed for wheezing or shortness of breath. 08/29/13   Reuben Likesavid C Keller, MD  benzonatate (TESSALON PERLES) 100 MG capsule Take 1 capsule (100 mg total) by mouth 3 (three) times daily as needed for cough. 08/07/14   Ozella Rocksavid J Merrell, MD  chlorpheniramine-HYDROcodone (TUSSIONEX) 10-8 MG/5ML LQCR Take 2.5-5 mLs by mouth every 12 (twelve) hours as needed for cough. 08/07/14   Ozella Rocksavid J Merrell, MD  fluticasone (FLONASE) 50 MCG/ACT nasal spray Place 2 sprays into both nostrils at bedtime. 08/07/14   Ozella Rocksavid J Merrell, MD  ipratropium (ATROVENT) 0.06 % nasal spray Place 2 sprays into both nostrils 4 (four) times daily. 08/07/14   Ozella Rocksavid J Merrell, MD   BP 121/79 mmHg  Pulse 96  Temp(Src) 98.3 F (36.8 C) (Oral)  Resp 16  SpO2 98% Physical Exam  Constitutional: He is oriented to person, place, and time. He appears well-developed and well-nourished. No distress.  Eyes: Conjunctivae are normal. Pupils are equal, round, and reactive to light.  Neck: Normal range of motion. Neck supple.  Cardiovascular: Normal rate, normal heart sounds and intact distal pulses.   Pulmonary/Chest: Effort normal and breath sounds normal.  Abdominal: Soft. Bowel sounds are normal. He exhibits no mass. There is no tenderness. There is no rebound.  Lymphadenopathy:    He has no cervical adenopathy.  Neurological: He is alert and oriented to person, place, and time.  Skin: Skin is warm and dry.  Nursing note and vitals reviewed.   ED Course  Procedures (including critical care time) Labs Review Labs Reviewed - No data to display  Imaging Review No results  found.   MDM   1. Malaise and fatigue    Sent for eval of diaphoresis and early satiey for 6d.    Linna HoffJames D Kindl, MD 09/22/14 [id number]  Linna HoffJames D Kindl, MD 09/22/14 [phone]

## 2014-09-29 ENCOUNTER — Telehealth: Payer: Self-pay | Admitting: Internal Medicine

## 2014-09-29 NOTE — Telephone Encounter (Signed)
Pt has not been here in 3 years will you except him back as a patient.

## 2014-09-29 NOTE — Telephone Encounter (Signed)
ok 

## 2014-10-01 NOTE — Telephone Encounter (Signed)
Pt has been sch for feb 2016

## 2014-11-20 ENCOUNTER — Ambulatory Visit (INDEPENDENT_AMBULATORY_CARE_PROVIDER_SITE_OTHER): Payer: BC Managed Care – PPO | Admitting: Internal Medicine

## 2014-11-20 ENCOUNTER — Encounter: Payer: Self-pay | Admitting: Internal Medicine

## 2014-11-20 VITALS — BP 130/86 | HR 72 | Temp 98.9°F | Resp 20 | Ht 73.5 in | Wt 219.0 lb

## 2014-11-20 DIAGNOSIS — R7989 Other specified abnormal findings of blood chemistry: Secondary | ICD-10-CM

## 2014-11-20 DIAGNOSIS — Z0000 Encounter for general adult medical examination without abnormal findings: Secondary | ICD-10-CM

## 2014-11-20 DIAGNOSIS — R748 Abnormal levels of other serum enzymes: Secondary | ICD-10-CM

## 2014-11-20 NOTE — Progress Notes (Signed)
Subjective:    Patient ID: Troy Gonzalez, male    DOB: 08-17-1956, 59 y.o.   MRN: [id number]  HPI  59 year-old patient who is seen today to re-establish with our practice.  He has enjoyed remarkably good health and has had no prior hospital admissions. He has no chronic illnesses and takes no chronic medications.` He was referred to urology at his initial exam for a circumcision .  Still No prior colonoscopies  Family history father died in his 1560s unclear cause Mother died age 60 apparently surgical complications of a total hip replacement she died during surgery not postop; history of asthma 2 brothers deceased one an accidental death one died age 60 unclear causes 5 sisters positive for chronic kidney disease  Past Medical History  Diagnosis Date  . History of chicken pox     History   Social History  . Marital Status: Married    Spouse Name: N/A  . Number of Children: N/A  . Years of Education: N/A   Occupational History  . Not on file.   Social History Main Topics  . Smoking status: Never Smoker   . Smokeless tobacco: Never Used  . Alcohol Use: No  . Drug Use: No  . Sexual Activity: Not on file   Other Topics Concern  . Not on file   Social History Narrative    Past Surgical History  Procedure Laterality Date  . None    . Circumcision  09/22/2011    Procedure: CIRCUMCISION ADULT;  Surgeon: Lindaann SloughMarc-Henry Nesi, MD;  [facility];  Service: Urology;  Laterality: N/A;    Family History  Problem Relation Age of Onset  . Asthma Mother   . Kidney disease Sister   . Stroke Maternal Grandmother     No Known Allergies  No current outpatient prescriptions on file prior to visit.   No current facility-administered medications on file prior to visit.    BP 130/86 mmHg  Pulse 72  Temp(Src) 98.9 F (37.2 C) (Oral)  Resp 20  Ht 6' 1.5" (1.867 m)  Wt 219 lb (99.338 kg)  BMI 28.50 kg/m2  SpO2 98%     Review of Systems    Constitutional: Negative for fever, chills, activity change, appetite change and fatigue.  HENT: Negative for congestion, dental problem, ear pain, hearing loss, mouth sores, rhinorrhea, sinus pressure, sneezing, tinnitus, trouble swallowing and voice change.   Eyes: Negative for photophobia, pain, redness and visual disturbance.  Respiratory: Negative for apnea, cough, choking, chest tightness, shortness of breath and wheezing.   Cardiovascular: Negative for chest pain, palpitations and leg swelling.  Gastrointestinal: Negative for nausea, vomiting, abdominal pain, diarrhea, constipation, blood in stool, abdominal distention, anal bleeding and rectal pain.  Genitourinary: Negative for dysuria, urgency, frequency, hematuria, flank pain, decreased urine volume, discharge, penile swelling, scrotal swelling, difficulty urinating, genital sores and testicular pain.  Musculoskeletal: Negative for myalgias, back pain, joint swelling, arthralgias, gait problem, neck pain and neck stiffness.  Skin: Negative for color change, rash and wound.  Neurological: Negative for dizziness, tremors, seizures, syncope, facial asymmetry, speech difficulty, weakness, light-headedness, numbness and headaches.  Hematological: Negative for adenopathy. Does not bruise/bleed easily.  Psychiatric/Behavioral: Negative for suicidal ideas, hallucinations, behavioral problems, confusion, sleep disturbance, self-injury, dysphoric mood, decreased concentration and agitation. The patient is not nervous/anxious.        Objective:   Physical Exam  Constitutional: He appears well-developed and well-nourished.  HENT:  Head: Normocephalic and atraumatic.  Right  Ear: External ear normal.  Left Ear: External ear normal.  Nose: Nose normal.  Mouth/Throat: Oropharynx is clear and moist.  Edentulous  Eyes: Conjunctivae and EOM are normal. Pupils are equal, round, and reactive to light. No scleral icterus.  Neck: Normal range of  motion. Neck supple. No JVD present. No thyromegaly present.  Cardiovascular: Regular rhythm, normal heart sounds and intact distal pulses.  Exam reveals no gallop and no friction rub.   No murmur heard. Pulmonary/Chest: Effort normal and breath sounds normal. He exhibits no tenderness.  Abdominal: Soft. Bowel sounds are normal. He exhibits no distension and no mass. There is no tenderness.  Genitourinary: Prostate normal and penis normal. Guaiac negative stool.  Musculoskeletal: Normal range of motion. He exhibits no edema or tenderness.  Lymphadenopathy:    He has no cervical adenopathy.  Neurological: He is alert. He has normal reflexes. No cranial nerve deficit. Coordination normal.  Skin: Skin is warm and dry. No rash noted.  Onychomycotic nails  Psychiatric: He has a normal mood and affect. His behavior is normal.          Assessment & Plan:   Preventive health examination History of elevated creatinine in the setting of acute illness and clinical dehydration.  Will repeat  Screening colonoscopy.  Encouraged

## 2014-11-20 NOTE — Progress Notes (Signed)
Pre visit review using our clinic review tool, if applicable. No additional management support is needed unless otherwise documented below in the visit note. 

## 2014-11-20 NOTE — Patient Instructions (Addendum)
Schedule your colonoscopy to help detect colon cancer.    It is important that you exercise regularly, at least 20 minutes 3 to 4 times per week.  If you develop chest pain or shortness of breath seek  medical attention.  Health Maintenance A healthy lifestyle and preventative care can promote health and wellness.  Maintain regular health, dental, and eye exams.  Eat a healthy diet. Foods like vegetables, fruits, whole grains, low-fat dairy products, and lean protein foods contain the nutrients you need and are low in calories. Decrease your intake of foods high in solid fats, added sugars, and salt. Get information about a proper diet from your health care provider, if necessary.  Regular physical exercise is one of the most important things you can do for your health. Most adults should get at least 150 minutes of moderate-intensity exercise (any activity that increases your heart rate and causes you to sweat) each week. In addition, most adults need muscle-strengthening exercises on 2 or more days a week.   Maintain a healthy weight. The body mass index (BMI) is a screening tool to identify possible weight problems. It provides an estimate of body fat based on height and weight. Your health care provider can find your BMI and can help you achieve or maintain a healthy weight. For males 20 years and older:  A BMI below 18.5 is considered underweight.  A BMI of 18.5 to 24.9 is normal.  A BMI of 25 to 29.9 is considered overweight.  A BMI of 30 and above is considered obese.  Maintain normal blood lipids and cholesterol by exercising and minimizing your intake of saturated fat. Eat a balanced diet with plenty of fruits and vegetables. Blood tests for lipids and cholesterol should begin at age 60 and be repeated every 5 years. If your lipid or cholesterol levels are high, you are over age 60, or you are at high risk for heart disease, you may need your cholesterol levels checked more  frequently.Ongoing high lipid and cholesterol levels should be treated with medicines if diet and exercise are not working.  If you smoke, find out from your health care provider how to quit. If you do not use tobacco, do not start.  Lung cancer screening is recommended for adults aged 55-80 years who are at high risk for developing lung cancer because of a history of smoking. A yearly low-dose CT scan of the lungs is recommended for people who have at least a 30-pack-year history of smoking and are current smokers or have quit within the past 15 years. A pack year of smoking is smoking an average of 1 pack of cigarettes a day for 1 year (for example, a 30-pack-year history of smoking could mean smoking 1 pack a day for 30 years or 2 packs a day for 15 years). Yearly screening should continue until the smoker has stopped smoking for at least 15 years. Yearly screening should be stopped for people who develop a health problem that would prevent them from having lung cancer treatment.  If you choose to drink alcohol, do not have more than 2 drinks per day. One drink is considered to be 12 oz (360 mL) of beer, 5 oz (150 mL) of wine, or 1.5 oz (45 mL) of liquor.  Avoid the use of street drugs. Do not share needles with anyone. Ask for help if you need support or instructions about stopping the use of drugs.  High blood pressure causes heart disease and increases the  risk of stroke. Blood pressure should be checked at least every 1-2 years. Ongoing high blood pressure should be treated with medicines if weight loss and exercise are not effective.  If you are 67-15 years old, ask your health care provider if you should take aspirin to prevent heart disease.  Diabetes screening involves taking a blood sample to check your fasting blood sugar level. This should be done once every 3 years after age 61 if you are at a normal weight and without risk factors for diabetes. Testing should be considered at a younger  age or be carried out more frequently if you are overweight and have at least 1 risk factor for diabetes.  Colorectal cancer can be detected and often prevented. Most routine colorectal cancer screening begins at the age of 6 and continues through age 76. However, your health care provider may recommend screening at an earlier age if you have risk factors for colon cancer. On a yearly basis, your health care provider may provide home test kits to check for hidden blood in the stool. A small camera at the end of a tube may be used to directly examine the colon (sigmoidoscopy or colonoscopy) to detect the earliest forms of colorectal cancer. Talk to your health care provider about this at age 61 when routine screening begins. A direct exam of the colon should be repeated every 5-10 years through age 28, unless early forms of precancerous polyps or small growths are found.  People who are at an increased risk for hepatitis B should be screened for this virus. You are considered at high risk for hepatitis B if:  You were born in a country where hepatitis B occurs often. Talk with your health care provider about which countries are considered high risk.  Your parents were born in a high-risk country and you have not received a shot to protect against hepatitis B (hepatitis B vaccine).  You have HIV or AIDS.  You use needles to inject street drugs.  You live with, or have sex with, someone who has hepatitis B.  You are a man who has sex with other men (MSM).  You get hemodialysis treatment.  You take certain medicines for conditions like cancer, organ transplantation, and autoimmune conditions.  Hepatitis C blood testing is recommended for all people born from [birth year] through 1966 and any individual with known risk factors for hepatitis C.  Healthy men should no longer receive prostate-specific antigen (PSA) blood tests as part of routine cancer screening. Talk to your health care provider about  prostate cancer screening.  Testicular cancer screening is not recommended for adolescents or adult males who have no symptoms. Screening includes self-exam, a health care provider exam, and other screening tests. Consult with your health care provider about any symptoms you have or any concerns you have about testicular cancer.  Practice safe sex. Use condoms and avoid high-risk sexual practices to reduce the spread of sexually transmitted infections (STIs).  You should be screened for STIs, including gonorrhea and chlamydia if:  You are sexually active and are younger than 24 years.  You are older than 24 years, and your health care provider tells you that you are at risk for this type of infection.  Your sexual activity has changed since you were last screened, and you are at an increased risk for chlamydia or gonorrhea. Ask your health care provider if you are at risk.  If you are at risk of being infected with HIV, it is  recommended that you take a prescription medicine daily to prevent HIV infection. This is called pre-exposure prophylaxis (PrEP). You are considered at risk if:  You are a man who has sex with other men (MSM).  You are a heterosexual man who is sexually active with multiple partners.  You take drugs by injection.  You are sexually active with a partner who has HIV.  Talk with your health care provider about whether you are at high risk of being infected with HIV. If you choose to begin PrEP, you should first be tested for HIV. You should then be tested every 3 months for as long as you are taking PrEP.  Use sunscreen. Apply sunscreen liberally and repeatedly throughout the day. You should seek shade when your shadow is shorter than you. Protect yourself by wearing long sleeves, pants, a wide-brimmed hat, and sunglasses year round whenever you are outdoors.  Tell your health care provider of new moles or changes in moles, especially if there is a change in shape or  color. Also, tell your health care provider if a mole is larger than the size of a pencil eraser.  A one-time screening for abdominal aortic aneurysm (AAA) and surgical repair of large AAAs by ultrasound is recommended for men aged 67-75 years who are current or former smokers.  Stay current with your vaccines (immunizations). Document Released: 03/09/2008 Document Revised: 09/16/2013 Document Reviewed: 02/06/2011 [facility] Patient Information 2015 [place], [place]. This information is not intended to replace advice given to you by your health care provider. Make sure you discuss any questions you have with your health care provider.

## 2014-11-23 ENCOUNTER — Other Ambulatory Visit: Payer: BC Managed Care – PPO

## 2014-11-25 LAB — BASIC METABOLIC PANEL
BUN: 12 mg/dL (ref 6–23)
CHLORIDE: 108 meq/L (ref 96–112)
CO2: 22 meq/L (ref 19–32)
Calcium: 9.2 mg/dL (ref 8.4–10.5)
Creatinine, Ser: 1.23 mg/dL (ref 0.40–1.50)
GFR: 77.31 mL/min (ref >60.00)
Glucose, Bld: 126 mg/dL — ABNORMAL HIGH (ref 70–99)
Potassium: 4.2 mEq/L (ref 3.5–5.1)
Sodium: 140 mEq/L (ref 135–145)

## 2014-11-25 NOTE — Addendum Note (Signed)
Addended by: Rita OharaHRASHER, Malikye Reppond R on: 11/25/2014 08:50 AM   Modules accepted: Orders

## 2015-02-24 ENCOUNTER — Encounter (HOSPITAL_COMMUNITY): Payer: Self-pay | Admitting: Emergency Medicine

## 2015-02-24 ENCOUNTER — Emergency Department (HOSPITAL_COMMUNITY)
Admission: EM | Admit: 2015-02-24 | Discharge: 2015-02-24 | Disposition: A | Discharge status: Home / Self Care | Payer: BLUE CROSS/BLUE SHIELD | Source: Home / Self Care | Attending: Emergency Medicine | Admitting: Emergency Medicine

## 2015-02-24 DIAGNOSIS — B029 Zoster without complications: Secondary | ICD-10-CM | POA: Diagnosis not present

## 2015-02-24 MED ORDER — PREDNISONE 20 MG PO TABS: ORAL_TABLET | ORAL | Status: DC

## 2015-02-24 MED ORDER — VALACYCLOVIR HCL 1 G PO TABS: 1000.0000 mg | ORAL_TABLET | Freq: Three times a day (TID) | ORAL | Status: AC

## 2015-02-24 NOTE — ED Provider Notes (Signed)
CSN: [id number]     Arrival date & time 02/24/15  1601 History   First MD Initiated Contact with Patient 02/24/15 1723     Chief Complaint  Patient presents with  . Rash   (Consider location/radiation/quality/duration/timing/severity/associated sxs/prior Treatment) HPI  He is a 59 year old man here for evaluation of rash. He states it started about one week ago with a few spots on the ulnar side of his left arm. The rash has progressively gotten worse, but has stayed on the ulnar side of his left arm. There may be a few spots on his right lower arm. He states it is a little uncomfortable and itches a little bit, but nothing bad. No fevers or chills. No known exposure to poison ivy or poison oak. He denies any history of allergy to poison ivy.  Past Medical History  Diagnosis Date  . History of chicken pox    Past Surgical History  Procedure Laterality Date  . None    . Circumcision  09/22/2011    Procedure: CIRCUMCISION ADULT;  Surgeon: Lindaann SloughMarc-Henry Nesi, MD;  [facility];  Service: Urology;  Laterality: N/A;   Family History  Problem Relation Age of Onset  . Asthma Mother   . Kidney disease Sister   . Stroke Maternal Grandmother    History  Substance Use Topics  . Smoking status: Never Smoker   . Smokeless tobacco: Never Used  . Alcohol Use: No    Review of Systems As in history of present illness Allergies  Review of patient's allergies indicates no known allergies.  Home Medications   Prior to Admission medications   Medication Sig Start Date End Date Taking? Authorizing Provider  ibuprofen (ADVIL,MOTRIN) 200 MG tablet Take 400 mg by mouth as needed.    Historical Provider, MD  predniSONE (DELTASONE) 20 MG tablet Take 3 tablets for 3 days, then 2 tablets for 3 days, then 1 tablet for 3 days. 02/24/15   Charm RingsErin J Honig, MD  valACYclovir (VALTREX) 1000 MG tablet Take 1 tablet (1,000 mg total) by mouth 3 (three) times daily. 02/24/15 03/10/15  Charm RingsErin J Honig,  MD   BP 122/75 mmHg  Pulse 68  Temp(Src) 97.8 F (36.6 C) (Oral)  Resp 12  SpO2 97% Physical Exam  Constitutional: He is oriented to person, place, and time. He appears well-developed and well-nourished. No distress.  Cardiovascular: Normal rate.   Pulmonary/Chest: Effort normal.  Neurological: He is alert and oriented to person, place, and time.  Skin:  He has multiple clusters of vesicles on his ulnar forearm. He has 3 papules on the right forearm.    ED Course  Procedures (including critical care time) Labs Review Labs Reviewed - No data to display  Imaging Review No results found.   MDM   1. Shingles    We'll treat with Valtrex and prednisone. Follow-up as needed.    Charm RingsErin J Honig, MD 02/24/15 [phone]

## 2015-02-24 NOTE — Discharge Instructions (Signed)
You likely have shingles. Take Valtrex and prednisone as prescribed. The rash should improve over the next week. Follow-up as needed.

## 2015-02-24 NOTE — ED Notes (Signed)
Pt states that he has had a rash on his arm for 7 days and it is getting worse.

## 2015-03-18 ENCOUNTER — Ambulatory Visit (INDEPENDENT_AMBULATORY_CARE_PROVIDER_SITE_OTHER): Payer: BLUE CROSS/BLUE SHIELD | Admitting: Internal Medicine

## 2015-03-18 VITALS — BP 122/80 | HR 66 | Temp 98.4°F | Resp 20 | Ht 73.5 in | Wt 225.0 lb

## 2015-03-18 DIAGNOSIS — M25561 Pain in right knee: Secondary | ICD-10-CM

## 2015-03-18 MED ORDER — MELOXICAM 15 MG PO TABS: 15.0000 mg | ORAL_TABLET | Freq: Every day | ORAL | Status: DC

## 2015-03-18 NOTE — Patient Instructions (Signed)
You  may move around, but avoid painful motions and activities.  Apply ice to the sore area for 15 to 20 minutes 3 or 4 times daily for the next two to 3 days. 

## 2015-03-18 NOTE — Progress Notes (Signed)
Pre visit review using our clinic review tool, if applicable. No additional management support is needed unless otherwise documented below in the visit note. 

## 2015-03-18 NOTE — Progress Notes (Signed)
   Subjective:    Patient ID: Troy Gonzalez, male    DOB: March 20, 1956, 59 y.o.   MRN: [id number]  HPI  59 year old patient who presents with a two-week history of atraumatic right knee pain.  No prior history of gout.  He takes no chronic medications. No systemic complaints  Past Medical History  Diagnosis Date  . History of chicken pox     History   Social History  . Marital Status: Married    Spouse Name: N/A  . Number of Children: N/A  . Years of Education: N/A   Occupational History  . Not on file.   Social History Main Topics  . Smoking status: Never Smoker   . Smokeless tobacco: Never Used  . Alcohol Use: No  . Drug Use: No  . Sexual Activity: Not on file   Other Topics Concern  . Not on file   Social History Narrative    Past Surgical History  Procedure Laterality Date  . None    . Circumcision  09/22/2011    Procedure: CIRCUMCISION ADULT;  Surgeon: Lindaann Slough, MD;  Location: [facility];  Service: Urology;  Laterality: N/A;    Family History  Problem Relation Age of Onset  . Asthma Mother   . Kidney disease Sister   . Stroke Maternal Grandmother     No Known Allergies  Current Outpatient Prescriptions on File Prior to Visit  Medication Sig Dispense Refill  . ibuprofen (ADVIL,MOTRIN) 200 MG tablet Take 400 mg by mouth as needed.     No current facility-administered medications on file prior to visit.    BP 122/80 mmHg  Pulse 66  Temp(Src) 98.4 F (36.9 C) (Oral)  Resp 20  Ht 6' 1.5" (1.867 m)  Wt 225 lb (102.059 kg)  BMI 29.28 kg/m2  SpO2 98%     Review of Systems  Constitutional: Negative for fever, chills, appetite change and fatigue.  HENT: Negative for congestion, dental problem, ear pain, hearing loss, sore throat, tinnitus, trouble swallowing and voice change.   Eyes: Negative for pain, discharge and visual disturbance.  Respiratory: Negative for cough, chest tightness, wheezing and stridor.     Cardiovascular: Negative for chest pain, palpitations and leg swelling.  Gastrointestinal: Negative for nausea, vomiting, abdominal pain, diarrhea, constipation, blood in stool and abdominal distention.  Genitourinary: Negative for urgency, hematuria, flank pain, discharge, difficulty urinating and genital sores.  Musculoskeletal: Positive for joint swelling. Negative for myalgias, back pain, arthralgias, gait problem and neck stiffness.  Skin: Negative for rash.  Neurological: Negative for dizziness, syncope, speech difficulty, weakness, numbness and headaches.  Hematological: Negative for adenopathy. Does not bruise/bleed easily.  Psychiatric/Behavioral: Negative for behavioral problems and dysphoric mood. The patient is not nervous/anxious.        Objective:   Physical Exam  Constitutional: He appears well-developed and well-nourished. No distress.  Musculoskeletal:  Right knee with mild effusion and warm to touch Fairly full range of motion          Assessment & Plan:   Right knee pain.  Possible degenerated meniscal tear.  Will treat with Modic for 2 or 3 weeks in moderate his activities somewhat.  If pain is not improved or worsens, will set up for orthopedic referral

## 2015-05-25 ENCOUNTER — Emergency Department (HOSPITAL_COMMUNITY)
Admission: EM | Admit: 2015-05-25 | Discharge: 2015-05-25 | Disposition: A | Discharge status: Home / Self Care | Payer: No Typology Code available for payment source | Attending: Emergency Medicine | Admitting: Emergency Medicine

## 2015-05-25 ENCOUNTER — Encounter (HOSPITAL_COMMUNITY): Payer: Self-pay | Admitting: Emergency Medicine

## 2015-05-25 DIAGNOSIS — S3992XA Unspecified injury of lower back, initial encounter: Secondary | ICD-10-CM | POA: Diagnosis not present

## 2015-05-25 DIAGNOSIS — Y998 Other external cause status: Secondary | ICD-10-CM | POA: Diagnosis not present

## 2015-05-25 DIAGNOSIS — Z23 Encounter for immunization: Secondary | ICD-10-CM | POA: Diagnosis not present

## 2015-05-25 DIAGNOSIS — Z8619 Personal history of other infectious and parasitic diseases: Secondary | ICD-10-CM | POA: Diagnosis not present

## 2015-05-25 DIAGNOSIS — Y92410 Unspecified street and highway as the place of occurrence of the external cause: Secondary | ICD-10-CM | POA: Diagnosis not present

## 2015-05-25 DIAGNOSIS — T07XXXA Unspecified multiple injuries, initial encounter: Secondary | ICD-10-CM

## 2015-05-25 DIAGNOSIS — Y9389 Activity, other specified: Secondary | ICD-10-CM | POA: Diagnosis not present

## 2015-05-25 DIAGNOSIS — S30810A Abrasion of lower back and pelvis, initial encounter: Secondary | ICD-10-CM | POA: Diagnosis not present

## 2015-05-25 DIAGNOSIS — S50312A Abrasion of left elbow, initial encounter: Secondary | ICD-10-CM | POA: Diagnosis not present

## 2015-05-25 MED ORDER — METHOCARBAMOL 500 MG PO TABS: 500.0000 mg | ORAL_TABLET | Freq: Two times a day (BID) | ORAL | Status: DC

## 2015-05-25 MED ORDER — IBUPROFEN 800 MG PO TABS: 800.0000 mg | ORAL_TABLET | Freq: Three times a day (TID) | ORAL | Status: DC

## 2015-05-25 MED ORDER — TETANUS-DIPHTH-ACELL PERTUSSIS 5-2.5-18.5 LF-MCG/0.5 IM SUSP
0.5000 mL | Freq: Once | INTRAMUSCULAR | Status: AC
  Administered 2015-05-25: 0.5 mL via INTRAMUSCULAR
  Filled 2015-05-25: qty 0.5

## 2015-05-25 NOTE — Discharge Instructions (Signed)
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
## 2016-03-30 ENCOUNTER — Encounter (HOSPITAL_COMMUNITY): Payer: Self-pay | Admitting: *Deleted

## 2016-03-30 ENCOUNTER — Ambulatory Visit (HOSPITAL_COMMUNITY)
Admission: EM | Admit: 2016-03-30 | Discharge: 2016-03-30 | Disposition: A | Discharge status: Home / Self Care | Payer: BLUE CROSS/BLUE SHIELD | Attending: Emergency Medicine | Admitting: Emergency Medicine

## 2016-03-30 DIAGNOSIS — J0100 Acute maxillary sinusitis, unspecified: Secondary | ICD-10-CM | POA: Diagnosis not present

## 2016-03-30 MED ORDER — AMOXICILLIN 500 MG PO CAPS: 500.0000 mg | ORAL_CAPSULE | Freq: Three times a day (TID) | ORAL | Status: DC

## 2016-03-30 NOTE — Discharge Instructions (Signed)
Sinusitis, Adult  Sinusitis is redness, soreness, and puffiness (inflammation) of the air pockets in the bones of your face (sinuses). The redness, soreness, and puffiness can cause air and mucus to get trapped in your sinuses. This can allow germs to grow and cause an infection.   HOME CARE    Drink enough fluids to keep your pee (urine) clear or pale yellow.   Use a humidifier in your home.   Run a hot shower to create steam in the bathroom. Sit in the bathroom with the door closed. Breathe in the steam 3-4 times a day.   Put a warm, moist washcloth on your face 3-4 times a day, or as told by your doctor.   Use salt water sprays (saline sprays) to wet the thick fluid in your nose. This can help the sinuses drain.   Only take medicine as told by your doctor.  GET HELP RIGHT AWAY IF:    Your pain gets worse.   You have very bad headaches.   You are sick to your stomach (nauseous).   You throw up (vomit).   You are very sleepy (drowsy) all the time.   Your face is puffy (swollen).   Your vision changes.   You have a stiff neck.   You have trouble breathing.  MAKE SURE YOU:    Understand these instructions.   Will watch your condition.   Will get help right away if you are not doing well or get worse.     This information is not intended to replace advice given to you by your health care provider. Make sure you discuss any questions you have with your health care provider.     Document Released: 02/28/2008 Document Revised: 10/02/2014 Document Reviewed: 04/16/2012  [employer] Interactive Patient Education 2016 [employer]

## 2016-03-30 NOTE — ED Provider Notes (Signed)
CSN: [id number]     Arrival date & time 03/30/16  [id number] History   First MD Initiated Contact with Patient 03/30/16 1028     Chief Complaint  Patient presents with  . URI   (Consider location/radiation/quality/duration/timing/severity/associated sxs/prior Treatment) HPI Patient is a 60 year old male states that he has had pain and pressure in his sinuses for the last week and a half. States that he has used over-the-counter medications without relief of his symptoms. His pain score is about 1 today. He denies any fever. There is a constant in his discomfort and his sinuses is described as an ache, pressure. Past Medical History  Diagnosis Date  . History of chicken pox    Past Surgical History  Procedure Laterality Date  . None    . Circumcision  09/22/2011    Procedure: CIRCUMCISION ADULT;  Surgeon: Lindaann SloughMarc-Henry Nesi, MD;  [facility] [place];  Service: Urology;  Laterality: N/A;   Family History  Problem Relation Age of Onset  . Asthma Mother   . Kidney disease Sister   . Stroke Maternal Grandmother    Social History  Substance Use Topics  . Smoking status: Never Smoker   . Smokeless tobacco: Never Used  . Alcohol Use: No    Review of Systems  Denies: HEADACHE, NAUSEA, ABDOMINAL PAIN, CHEST PAIN, CONGESTION, DYSURIA, SHORTNESS OF BREATH  Allergies  Review of patient's allergies indicates no known allergies.  Home Medications   Prior to Admission medications   Medication Sig Start Date End Date Taking? Authorizing Provider  amoxicillin (AMOXIL) 500 MG capsule Take 1 capsule (500 mg total) by mouth 3 (three) times daily. 03/30/16   Tharon AquasFrank C Otho Michalik, PA  ibuprofen (ADVIL,MOTRIN) 800 MG tablet Take 1 tablet (800 mg total) by mouth 3 (three) times daily. 05/25/15   Fayrene HelperBowie Tran, PA-C  meloxicam (MOBIC) 15 MG tablet Take 1 tablet (15 mg total) by mouth daily. 03/18/15   Gordy SaversPeter F Kwiatkowski, MD  methocarbamol (ROBAXIN) 500 MG tablet Take 1 tablet (500 mg total) by mouth  2 (two) times daily. 05/25/15   Fayrene HelperBowie Tran, PA-C   Meds Ordered and Administered this Visit  Medications - No data to display  BP 131/83 mmHg  Pulse 65  Temp(Src) 99.1 F (37.3 C) (Oral)  Resp 18  SpO2 98% No data found.   Physical Exam NURSES NOTES AND VITAL SIGNS REVIEWED. CONSTITUTIONAL: Well developed, well nourished, no acute distress HEENT: normocephalic, atraumatic, There is tenderness over the left maxillary sinus. Decreased transillumination on the left side bogginess of the turbinates. Small amount of postnasal drainage noted. EYES: Conjunctiva normal NECK:normal ROM, supple, no adenopathy PULMONARY:No respiratory distress, normal effort ABDOMINAL: Soft, ND, NT BS+, No CVAT MUSCULOSKELETAL: Normal ROM of all extremities,  SKIN: warm and dry without rash PSYCHIATRIC: Mood and affect, behavior are normal  ED Course  Procedures (including critical care time)  Labs Review Labs Reviewed - No data to display  Imaging Review No results found.   Visual Acuity Review  Right Eye Distance:   Left Eye Distance:   Bilateral Distance:    Right Eye Near:   Left Eye Near:    Bilateral Near:         MDM   1. Acute maxillary sinusitis, recurrence not specified     Patient is reassured that there are no issues that require transfer to higher level of care at this time or additional tests. Patient is advised to continue home symptomatic treatment. Patient is advised that if there  are new or worsening symptoms to attend the emergency department, contact primary care provider, or return to UC. Instructions of care provided discharged home in stable condition.    THIS NOTE WAS GENERATED USING A VOICE RECOGNITION SOFTWARE PROGRAM. ALL REASONABLE EFFORTS  WERE MADE TO PROOFREAD THIS DOCUMENT FOR ACCURACY.  I have verbally reviewed the discharge instructions with the patient. A printed AVS was given to the patient.  All questions were answered prior to discharge.       Tharon AquasFrank C Weaver [place], PA 03/30/16 2103

## 2016-03-30 NOTE — ED Notes (Signed)
Patient to UC for cold like symptoms since Monday, reports stuffy nose and watery eyes. Unsure of fevers. Denies sore throat.

## 2016-04-28 ENCOUNTER — Other Ambulatory Visit (INDEPENDENT_AMBULATORY_CARE_PROVIDER_SITE_OTHER): Payer: BLUE CROSS/BLUE SHIELD

## 2016-04-28 DIAGNOSIS — Z0000 Encounter for general adult medical examination without abnormal findings: Secondary | ICD-10-CM | POA: Diagnosis not present

## 2016-04-28 LAB — POC URINALSYSI DIPSTICK (AUTOMATED)
BILIRUBIN UA: NEGATIVE
Blood, UA: NEGATIVE
GLUCOSE UA: NEGATIVE
Ketones, UA: NEGATIVE
LEUKOCYTES UA: NEGATIVE
NITRITE UA: NEGATIVE
Protein, UA: NEGATIVE
Spec Grav, UA: 1.02
UROBILINOGEN UA: 0.2
pH, UA: 5

## 2016-04-28 LAB — CBC WITH DIFFERENTIAL/PLATELET
Basophils Absolute: 0 10*3/uL (ref 0.0–0.1)
Basophils Relative: 0.4 % (ref 0.0–3.0)
EOS PCT: 3.4 % (ref 0.0–5.0)
Eosinophils Absolute: 0.1 10*3/uL (ref 0.0–0.7)
HCT: 37.6 % — ABNORMAL LOW (ref 39.0–52.0)
Hemoglobin: 12.5 g/dL — ABNORMAL LOW (ref 13.0–17.0)
LYMPHS ABS: 1.2 10*3/uL (ref 0.7–4.0)
Lymphocytes Relative: 40.9 % (ref 12.0–46.0)
MCHC: 33.2 g/dL (ref 30.0–36.0)
MCV: 89.5 fl (ref 78.0–100.0)
Monocytes Absolute: 0.3 10*3/uL (ref 0.1–1.0)
Monocytes Relative: 10.3 % (ref 3.0–12.0)
NEUTROS ABS: 1.4 10*3/uL (ref 1.4–7.7)
NEUTROS PCT: 45 % (ref 43.0–77.0)
PLATELETS: 133 10*3/uL — AB (ref 150.0–400.0)
RBC: 4.2 Mil/uL — ABNORMAL LOW (ref 4.22–5.81)
RDW: 14.6 % (ref 11.5–15.5)
WBC: 3 10*3/uL — ABNORMAL LOW (ref 4.0–10.5)

## 2016-04-28 LAB — BASIC METABOLIC PANEL
BUN: 14 mg/dL (ref 6–23)
CO2: 25 mEq/L (ref 19–32)
Calcium: 8.9 mg/dL (ref 8.4–10.5)
Chloride: 108 mEq/L (ref 96–112)
Creatinine, Ser: 1.03 mg/dL (ref 0.40–1.50)
GFR: 94.43 mL/min (ref >60.00)
GLUCOSE: 112 mg/dL — AB (ref 70–99)
POTASSIUM: 4 meq/L (ref 3.5–5.1)
Sodium: 140 mEq/L (ref 135–145)

## 2016-04-28 LAB — HEPATIC FUNCTION PANEL
ALBUMIN: 3.8 g/dL (ref 3.5–5.2)
ALT: 18 U/L (ref 0–53)
AST: 17 U/L (ref 0–37)
Alkaline Phosphatase: 53 U/L (ref 39–117)
Bilirubin, Direct: 0.1 mg/dL (ref 0.0–0.3)
Total Bilirubin: 0.4 mg/dL (ref 0.2–1.2)
Total Protein: 7.2 g/dL (ref 6.0–8.3)

## 2016-04-28 LAB — LIPID PANEL
CHOLESTEROL: 165 mg/dL (ref 0–200)
HDL: 44.4 mg/dL (ref >39.00)
LDL CALC: 109 mg/dL — AB (ref 0–99)
NonHDL: 120.6
TRIGLYCERIDES: 56 mg/dL (ref 0.0–149.0)
Total CHOL/HDL Ratio: 4
VLDL: 11.2 mg/dL (ref 0.0–40.0)

## 2016-04-28 LAB — TSH: TSH: 0.76 u[IU]/mL (ref 0.35–4.50)

## 2016-04-28 LAB — PSA: PSA: 0.68 ng/mL (ref 0.10–4.00)

## 2016-05-05 ENCOUNTER — Ambulatory Visit (INDEPENDENT_AMBULATORY_CARE_PROVIDER_SITE_OTHER): Payer: BLUE CROSS/BLUE SHIELD | Admitting: Internal Medicine

## 2016-05-05 ENCOUNTER — Encounter: Payer: Self-pay | Admitting: Internal Medicine

## 2016-05-05 VITALS — BP 120/80 | HR 70 | Temp 98.5°F | Ht 72.5 in | Wt 217.0 lb

## 2016-05-05 DIAGNOSIS — Z0000 Encounter for general adult medical examination without abnormal findings: Secondary | ICD-10-CM

## 2016-05-05 NOTE — Progress Notes (Signed)
Pre visit review using our clinic review tool, if applicable. No additional management support is needed unless otherwise documented below in the visit note. 

## 2016-05-05 NOTE — Patient Instructions (Addendum)
Schedule your colonoscopy to help detect colon cancer.    Return in one year for follow-up

## 2016-05-05 NOTE — Progress Notes (Signed)
Subjective:    Patient ID: Troy Gonzalez, male    DOB: 10-20-1955, 60 y.o.   MRN: [id number]  HPI   60 year-old patient who is seen today for an annual exam.  He has enjoyed remarkably good health and has had no prior hospital admissions. He has no chronic illnesses and takes no chronic medications.` He was referred to urology at his initial exam for a circumcision .   No prior screening colonoscopy  Family history father died in his 5860s unclear cause Mother died age 61 apparently surgical complications of a total hip replacement she died during surgery not postop; history of asthma 2 brothers deceased one an accidental death one died age 61 unclear causes 5 sisters positive for chronic kidney disease  Past Medical History:  Diagnosis Date  . History of chicken pox     Social History   Social History  . Marital status: Married    Spouse name: N/A  . Number of children: N/A  . Years of education: N/A   Occupational History  . Not on file.   Social History Main Topics  . Smoking status: Never Smoker  . Smokeless tobacco: Never Used  . Alcohol use No  . Drug use: No  . Sexual activity: Not on file   Other Topics Concern  . Not on file   Social History Narrative  . No narrative on file    Past Surgical History:  Procedure Laterality Date  . CIRCUMCISION  09/22/2011   Procedure: CIRCUMCISION ADULT;  Surgeon: Lindaann SloughMarc-Henry Nesi, MD;  [facility] [place];  Service: Urology;  Laterality: N/A;  . none      Family History  Problem Relation Age of Onset  . Asthma Mother   . Kidney disease Sister   . Stroke Maternal Grandmother     No Known Allergies  No current outpatient prescriptions on file prior to visit.   No current facility-administered medications on file prior to visit.     BP 120/80 (BP Location: Left Arm, Patient Position: Sitting, Cuff Size: Normal)   Pulse 70   Temp 98.5 F (36.9 C) (Oral)   Ht 6' 0.5" (1.842 m)   Wt 217 lb  (98.4 kg)   SpO2 97%   BMI 29.03 kg/m      Review of Systems  Constitutional: Negative for activity change, appetite change, chills, fatigue and fever.  HENT: Negative for congestion, dental problem, ear pain, hearing loss, mouth sores, rhinorrhea, sinus pressure, sneezing, tinnitus, trouble swallowing and voice change.   Eyes: Negative for photophobia, pain, redness and visual disturbance.  Respiratory: Negative for apnea, cough, choking, chest tightness, shortness of breath and wheezing.   Cardiovascular: Negative for chest pain, palpitations and leg swelling.  Gastrointestinal: Negative for abdominal distention, abdominal pain, anal bleeding, blood in stool, constipation, diarrhea, nausea, rectal pain and vomiting.  Genitourinary: Negative for decreased urine volume, difficulty urinating, discharge, dysuria, flank pain, frequency, genital sores, hematuria, penile swelling, scrotal swelling, testicular pain and urgency.  Musculoskeletal: Negative for arthralgias, back pain, gait problem, joint swelling, myalgias, neck pain and neck stiffness.  Skin: Negative for color change, rash and wound.  Neurological: Negative for dizziness, tremors, seizures, syncope, facial asymmetry, speech difficulty, weakness, light-headedness, numbness and headaches.  Hematological: Negative for adenopathy. Does not bruise/bleed easily.  Psychiatric/Behavioral: Negative for agitation, behavioral problems, confusion, decreased concentration, dysphoric mood, hallucinations, self-injury, sleep disturbance and suicidal ideas. The patient is not nervous/anxious.        Objective:  Physical Exam  Constitutional: He appears well-developed and well-nourished.  HENT:  Head: Normocephalic and atraumatic.  Right Ear: External ear normal.  Left Ear: External ear normal.  Nose: Nose normal.  Mouth/Throat: Oropharynx is clear and moist.  Edentulous  Eyes: Conjunctivae and EOM are normal. Pupils are equal, round, and  reactive to light. No scleral icterus.  Neck: Normal range of motion. Neck supple. No JVD present. No thyromegaly present.  Cardiovascular: Regular rhythm, normal heart sounds and intact distal pulses.  Exam reveals no gallop and no friction rub.   No murmur heard. Pulmonary/Chest: Effort normal and breath sounds normal. He exhibits no tenderness.  Abdominal: Soft. Bowel sounds are normal. He exhibits no distension and no mass. There is no tenderness.  Genitourinary: Prostate normal and penis normal. Rectal exam shows guaiac negative stool.  Musculoskeletal: Normal range of motion. He exhibits no edema or tenderness.  Soft tissue nodule left medial arch  Lymphadenopathy:    He has no cervical adenopathy.  Neurological: He is alert. He has normal reflexes. No cranial nerve deficit. Coordination normal.  Skin: Skin is warm and dry. No rash noted.  Onychomycotic nails  Psychiatric: He has a normal mood and affect. His behavior is normal.          Assessment & Plan:   Preventive health examination   Screening colonoscopy.  Encouraged.  Patient is agreeable  Laboratory profile reviewed  Rogelia Boga, MD

## 2016-06-05 ENCOUNTER — Encounter: Payer: Self-pay | Admitting: Internal Medicine

## 2017-06-14 ENCOUNTER — Encounter: Payer: Self-pay | Admitting: Internal Medicine

## 2017-10-08 ENCOUNTER — Ambulatory Visit (INDEPENDENT_AMBULATORY_CARE_PROVIDER_SITE_OTHER): Payer: Commercial Managed Care - PPO | Admitting: Family Medicine

## 2017-10-08 ENCOUNTER — Encounter: Payer: Self-pay | Admitting: Family Medicine

## 2017-10-08 VITALS — BP 120/80 | HR 76 | Temp 99.8°F | Wt 221.0 lb

## 2017-10-08 DIAGNOSIS — J069 Acute upper respiratory infection, unspecified: Secondary | ICD-10-CM | POA: Diagnosis not present

## 2017-10-08 DIAGNOSIS — B9789 Other viral agents as the cause of diseases classified elsewhere: Secondary | ICD-10-CM

## 2017-10-08 MED ORDER — HYDROCODONE-HOMATROPINE 5-1.5 MG/5ML PO SYRP: 5.0000 mL | ORAL_SOLUTION | Freq: Four times a day (QID) | ORAL | 0 refills | Status: AC | PRN

## 2017-10-08 NOTE — Patient Instructions (Signed)

## 2017-10-08 NOTE — Progress Notes (Signed)
Subjective:     Patient ID: Troy Gonzalez, male   DOB: [birth year], 62 y.o.   MRN: [id number]  HPI Patient is nonsmoker seen as a work in with onset last Wednesday of upper respiratory symptoms. He's had some nasal congestion and his main symptom has been cough. Very little sleep secondary to cough last night. Cough mostly nonproductive. No fever. He tried something equivalent to NyQuil last night which did not help his cough at all. He had some laryngitis symptoms this morning. No nausea or vomiting. No dyspnea  Past Medical History:  Diagnosis Date  . History of chicken pox    Past Surgical History:  Procedure Laterality Date  . CIRCUMCISION  09/22/2011   Procedure: CIRCUMCISION ADULT;  Surgeon: Lindaann SloughMarc-Henry Nesi, MD;  [facility] [place];  Service: Urology;  Laterality: N/A;  . none      reports that  has never smoked. he has never used smokeless tobacco. He reports that he does not drink alcohol or use drugs. family history includes Asthma in his mother; Kidney disease in his sister; Stroke in his maternal grandmother. No Known Allergies   Review of Systems  Constitutional: Negative for chills and fever.  HENT: Positive for congestion and voice change. Negative for sore throat.   Respiratory: Positive for cough. Negative for wheezing.        Objective:   Physical Exam  Constitutional: He appears well-developed and well-nourished.  HENT:  Right Ear: External ear normal.  Left Ear: External ear normal.  Mouth/Throat: Oropharynx is clear and moist.  Neck: Neck supple.  Cardiovascular: Normal rate and regular rhythm.  Pulmonary/Chest: Effort normal and breath sounds normal. No respiratory distress. He has no wheezes. He has no rales.  Lymphadenopathy:    He has no cervical adenopathy.       Assessment:     Probable acute viral upper respiratory infection with cough    Plan:     -Hycodan cough syrup 1 teaspoon daily at bedtime. For severe  cough -Increase hydration -Follow-up as needed for any fever or change of symptoms  Kristian CoveyBruce W Delorise Hunkele MD [facility]

## 2017-11-01 ENCOUNTER — Encounter: Payer: Self-pay | Admitting: Family Medicine

## 2017-11-01 ENCOUNTER — Ambulatory Visit (INDEPENDENT_AMBULATORY_CARE_PROVIDER_SITE_OTHER): Payer: Commercial Managed Care - PPO | Admitting: Family Medicine

## 2017-11-01 ENCOUNTER — Other Ambulatory Visit: Payer: Commercial Managed Care - PPO

## 2017-11-01 ENCOUNTER — Encounter: Payer: Self-pay | Admitting: *Deleted

## 2017-11-01 ENCOUNTER — Ambulatory Visit (INDEPENDENT_AMBULATORY_CARE_PROVIDER_SITE_OTHER)
Admission: RE | Admit: 2017-11-01 | Discharge: 2017-11-01 | Disposition: A | Discharge status: Home / Self Care | Payer: Commercial Managed Care - PPO | Source: Ambulatory Visit | Attending: Family Medicine | Admitting: Family Medicine

## 2017-11-01 VITALS — BP 100/58 | HR 94 | Temp 100.6°F | Ht 72.5 in

## 2017-11-01 DIAGNOSIS — R6889 Other general symptoms and signs: Secondary | ICD-10-CM

## 2017-11-01 DIAGNOSIS — R05 Cough: Secondary | ICD-10-CM

## 2017-11-01 DIAGNOSIS — R059 Cough, unspecified: Secondary | ICD-10-CM

## 2017-11-01 DIAGNOSIS — J111 Influenza due to unidentified influenza virus with other respiratory manifestations: Secondary | ICD-10-CM | POA: Diagnosis not present

## 2017-11-01 LAB — POC INFLUENZA A&B (BINAX/QUICKVUE)
Influenza A, POC: POSITIVE — AB
Influenza B, POC: NEGATIVE

## 2017-11-01 MED ORDER — BENZONATATE 100 MG PO CAPS: 100.0000 mg | ORAL_CAPSULE | Freq: Two times a day (BID) | ORAL | 0 refills | Status: DC | PRN

## 2017-11-01 MED ORDER — OSELTAMIVIR PHOSPHATE 75 MG PO CAPS: 75.0000 mg | ORAL_CAPSULE | Freq: Two times a day (BID) | ORAL | 0 refills | Status: DC

## 2017-11-01 NOTE — Progress Notes (Signed)
HPI:  Acute visit for flu like symptoms: -this started about 3-4 days ago -was just getting over a cold, reports he was actually doing much better then got sick again -symptoms include nasal congestion, sinus congestion, cough, emesis x1, sub fever, body aches, some malaise -denies SOB, rash, flu exposure, tick bite, diarrhea, recurrent emesis, inability to tolerate fluids  ROS: See pertinent positives and negatives per HPI.  Past Medical History:  Diagnosis Date  . History of chicken pox     Past Surgical History:  Procedure Laterality Date  . CIRCUMCISION  09/22/2011   Procedure: CIRCUMCISION ADULT;  Surgeon: Lindaann Slough, MD;  Location: [facility];  Service: Urology;  Laterality: N/A;  . none      Family History  Problem Relation Age of Onset  . Asthma Mother   . Kidney disease Sister   . Stroke Maternal Grandmother     Social History   Socioeconomic History  . Marital status: Married    Spouse name: None  . Number of children: None  . Years of education: None  . Highest education level: None  Social Needs  . Financial resource strain: None  . Food insecurity - worry: None  . Food insecurity - inability: None  . Transportation needs - medical: None  . Transportation needs - non-medical: None  Occupational History  . None  Tobacco Use  . Smoking status: Never Smoker  . Smokeless tobacco: Never Used  Substance and Sexual Activity  . Alcohol use: No  . Drug use: No  . Sexual activity: None  Other Topics Concern  . None  Social History Narrative  . None     Current Outpatient Medications:  .  benzonatate (TESSALON) 100 MG capsule, Take 1 capsule (100 mg total) by mouth 2 (two) times daily as needed for cough., Disp: 20 capsule, Rfl: 0 .  oseltamivir (TAMIFLU) 75 MG capsule, Take 1 capsule (75 mg total) by mouth 2 (two) times daily., Disp: 10 capsule, Rfl: 0  EXAM:  Vitals:   11/01/17 1510  BP: (!) 100/58  Pulse: 94  Temp: (!)  100.6 F (38.1 C)  SpO2: 95%    Body mass index is 29.56 kg/m.  GENERAL: vitals reviewed and listed above, alert, oriented, appears well hydrated and in no acute distress  HEENT: atraumatic, conjunttiva clear, no obvious abnormalities on inspection of external nose and ears, normal appearance of ear canals and TMs, clear nasal congestion, mild post oropharyngeal erythema with PND, no tonsillar edema or exudate, no sinus TTP  NECK: no obvious masses on inspection  LUNGS: clear to auscultation bilaterally, no wheezes, rales or rhonchi, good air movement  CV: HRRR, no peripheral edema  MS: moves all extremities without noticeable abnormality  PSYCH: pleasant and cooperative, no obvious depression or anxiety  ASSESSMENT AND PLAN:  Discussed the following assessment and plan:  Flu-like symptoms - Plan: POC Influenza A&B(BINAX/QUICKVUE)  Cough - Plan: DG Chest 2 View  Influenza  We discussed potential/likely etiologies, with influenza being likely or possible vs. viral infection or 2ndary LRI. We discussed risks/benefits/indications/best timing for tamiflu, symptomatic care, likely course, transmission, potential complications, signs of developing a serious illness and return and emergency precuations. He opted to do tamiflu if flu test +. Opted for CXR to exclude LRI - tx accordingly. Tessalon for cough after discussion risks. -Patient advised to return or notify a doctor immediately if symptoms worsen or persist or new concerns arise.  Patient Instructions  BEFORE YOU LEAVE: -flu test -  xray sheet  Go get the xray today  Plenty of fluids  I sent tessalon for the cough - may need antibiotic if xray shows infection  I hope you are feeling better soon! Seek care promptly if your symptoms worsen, new concerns arise or you are not improving with treatment.   Viral Illness, Adult Viruses are tiny germs that can get into a person's body and cause illness. There are many  different types of viruses, and they cause many types of illness. Viral illnesses can range from mild to severe. They can affect various parts of the body. Common illnesses that are caused by a virus include colds and the flu.  What are the causes? Many types of viruses can cause illness. Viruses invade cells in your body, multiply, and cause the infected cells to malfunction or die. When the cell dies, it releases more of the virus. When this happens, you develop symptoms of the illness, and the virus continues to spread to other cells. If the virus takes over the function of the cell, it can cause the cell to divide and grow out of control, as is the case when a virus causes cancer. Different viruses get into the body in different ways. You can get a virus by:  Swallowing food or water that is contaminated with the virus.  Breathing in droplets that have been coughed or sneezed into the air by an infected person.  Touching a surface that has been contaminated with the virus and then touching your eyes, nose, or mouth.  Being bitten by an insect or animal that carries the virus.  Having sexual contact with a person who is infected with the virus.  Being exposed to blood or fluids that contain the virus, either through an open cut or during a transfusion.  If a virus enters your body, your body's defense system (immune system) will try to fight the virus. You may be at higher risk for a viral illness if your immune system is weak. What are the signs or symptoms? Symptoms vary depending on the type of virus and the location of the cells that it invades. Common symptoms of the main types of viral illnesses include: Cold and flu viruses  Fever.  Headache.  Sore throat.  Muscle aches.  Nasal congestion.  Cough. Digestive system (gastrointestinal) viruses  Fever.  Abdominal pain.  Nausea.  Diarrhea. Liver viruses (hepatitis)  Loss of appetite.  Tiredness.  Yellowing of  the skin (jaundice). Brain and spinal cord viruses  Fever.  Headache.  Stiff neck.  Nausea and vomiting.  Confusion or sleepiness. Skin viruses  Warts.  Itching.  Rash. Sexually transmitted viruses  Discharge.  Swelling.  Redness.  Rash. How is this treated? Viruses can be difficult to treat because they live within cells. Antibiotic medicines do not treat viruses because these drugs do not get inside cells. Treatment for a viral illness may include:  Resting and drinking plenty of fluids.  Medicines to relieve symptoms. These can include over-the-counter medicine for pain and fever, medicines for cough or congestion, and medicines to relieve diarrhea.  Antiviral medicines. These drugs are available only for certain types of viruses. They may help reduce flu symptoms if taken early. There are also many antiviral medicines for hepatitis and HIV/AIDS.  Some viral illnesses can be prevented with vaccinations. A common example is the flu shot. Follow these instructions at home: Medicines   Take over-the-counter and prescription medicines only as told by your health care provider.  If you were prescribed an antiviral medicine, take it as told by your health care provider. Do not stop taking the medicine even if you start to feel better.  Be aware of when antibiotics are needed and when they are not needed. Antibiotics do not treat viruses. If your health care provider thinks that you may have a bacterial infection as well as a viral infection, you may get an antibiotic. ? Do not ask for an antibiotic prescription if you have been diagnosed with a viral illness. That will not make your illness go away faster. ? Frequently taking antibiotics when they are not needed can lead to antibiotic resistance. When this develops, the medicine no longer works against the bacteria that it normally fights. General instructions  Drink enough fluids to keep your urine clear or pale  yellow.  Rest as much as possible.  Return to your normal activities as told by your health care provider. Ask your health care provider what activities are safe for you.  Keep all follow-up visits as told by your health care provider. This is important. How is this prevented? Take these actions to reduce your risk of viral infection:  Eat a healthy diet and get enough rest.  Wash your hands often with soap and water. This is especially important when you are in public places. If soap and water are not available, use hand sanitizer.  Avoid close contact with friends and family who have a viral illness.  If you travel to areas where viral gastrointestinal infection is common, avoid drinking water or eating raw food.  Keep your immunizations up to date. Get a flu shot every year as told by your health care provider.  Do not share toothbrushes, nail clippers, razors, or needles with other people.  Always practice safe sex.  Contact a health care provider if:  You have symptoms of a viral illness that do not go away.  Your symptoms come back after going away.  Your symptoms get worse. Get help right away if:  You have trouble breathing.  You have a severe headache or a stiff neck.  You have severe vomiting or abdominal pain. This information is not intended to replace advice given to you by your health care provider. Make sure you discuss any questions you have with your health care provider. Document Released: 01/21/2016 Document Revised: 02/23/2016 Document Reviewed: 01/21/2016 Elsevier Interactive Patient Education  2018 [employer]       Terressa KoyanagiHannah R Ravleen Ries, DO

## 2017-11-01 NOTE — Patient Instructions (Signed)
BEFORE YOU LEAVE: -flu test -xray sheet  Go get the xray today  Plenty of fluids  I sent tessalon for the cough - may need antibiotic if xray shows infection  I hope you are feeling better soon! Seek care promptly if your symptoms worsen, new concerns arise or you are not improving with treatment.   Viral Illness, Adult Viruses are tiny germs that can get into a person's body and cause illness. There are many different types of viruses, and they cause many types of illness. Viral illnesses can range from mild to severe. They can affect various parts of the body. Common illnesses that are caused by a virus include colds and the flu.  What are the causes? Many types of viruses can cause illness. Viruses invade cells in your body, multiply, and cause the infected cells to malfunction or die. When the cell dies, it releases more of the virus. When this happens, you develop symptoms of the illness, and the virus continues to spread to other cells. If the virus takes over the function of the cell, it can cause the cell to divide and grow out of control, as is the case when a virus causes cancer. Different viruses get into the body in different ways. You can get a virus by:  Swallowing food or water that is contaminated with the virus.  Breathing in droplets that have been coughed or sneezed into the air by an infected person.  Touching a surface that has been contaminated with the virus and then touching your eyes, nose, or mouth.  Being bitten by an insect or animal that carries the virus.  Having sexual contact with a person who is infected with the virus.  Being exposed to blood or fluids that contain the virus, either through an open cut or during a transfusion.  If a virus enters your body, your body's defense system (immune system) will try to fight the virus. You may be at higher risk for a viral illness if your immune system is weak. What are the signs or symptoms? Symptoms vary  depending on the type of virus and the location of the cells that it invades. Common symptoms of the main types of viral illnesses include: Cold and flu viruses  Fever.  Headache.  Sore throat.  Muscle aches.  Nasal congestion.  Cough. Digestive system (gastrointestinal) viruses  Fever.  Abdominal pain.  Nausea.  Diarrhea. Liver viruses (hepatitis)  Loss of appetite.  Tiredness.  Yellowing of the skin (jaundice). Brain and spinal cord viruses  Fever.  Headache.  Stiff neck.  Nausea and vomiting.  Confusion or sleepiness. Skin viruses  Warts.  Itching.  Rash. Sexually transmitted viruses  Discharge.  Swelling.  Redness.  Rash. How is this treated? Viruses can be difficult to treat because they live within cells. Antibiotic medicines do not treat viruses because these drugs do not get inside cells. Treatment for a viral illness may include:  Resting and drinking plenty of fluids.  Medicines to relieve symptoms. These can include over-the-counter medicine for pain and fever, medicines for cough or congestion, and medicines to relieve diarrhea.  Antiviral medicines. These drugs are available only for certain types of viruses. They may help reduce flu symptoms if taken early. There are also many antiviral medicines for hepatitis and HIV/AIDS.  Some viral illnesses can be prevented with vaccinations. A common example is the flu shot. Follow these instructions at home: Medicines   Take over-the-counter and prescription medicines only as told by  your health care provider.  If you were prescribed an antiviral medicine, take it as told by your health care provider. Do not stop taking the medicine even if you start to feel better.  Be aware of when antibiotics are needed and when they are not needed. Antibiotics do not treat viruses. If your health care provider thinks that you may have a bacterial infection as well as a viral infection, you may get an  antibiotic. ? Do not ask for an antibiotic prescription if you have been diagnosed with a viral illness. That will not make your illness go away faster. ? Frequently taking antibiotics when they are not needed can lead to antibiotic resistance. When this develops, the medicine no longer works against the bacteria that it normally fights. General instructions  Drink enough fluids to keep your urine clear or pale yellow.  Rest as much as possible.  Return to your normal activities as told by your health care provider. Ask your health care provider what activities are safe for you.  Keep all follow-up visits as told by your health care provider. This is important. How is this prevented? Take these actions to reduce your risk of viral infection:  Eat a healthy diet and get enough rest.  Wash your hands often with soap and water. This is especially important when you are in public places. If soap and water are not available, use hand sanitizer.  Avoid close contact with friends and family who have a viral illness.  If you travel to areas where viral gastrointestinal infection is common, avoid drinking water or eating raw food.  Keep your immunizations up to date. Get a flu shot every year as told by your health care provider.  Do not share toothbrushes, nail clippers, razors, or needles with other people.  Always practice safe sex.  Contact a health care provider if:  You have symptoms of a viral illness that do not go away.  Your symptoms come back after going away.  Your symptoms get worse. Get help right away if:  You have trouble breathing.  You have a severe headache or a stiff neck.  You have severe vomiting or abdominal pain. This information is not intended to replace advice given to you by your health care provider. Make sure you discuss any questions you have with your health care provider. Document Released: 01/21/2016 Document Revised: 02/23/2016 Document Reviewed:  01/21/2016 Elsevier Interactive Patient Education  [employer]2018 Elsevier Inc[employer]

## 2018-05-30 ENCOUNTER — Encounter: Payer: Self-pay | Admitting: Internal Medicine

## 2018-05-30 ENCOUNTER — Ambulatory Visit (INDEPENDENT_AMBULATORY_CARE_PROVIDER_SITE_OTHER): Payer: Commercial Managed Care - PPO | Admitting: Internal Medicine

## 2018-05-30 VITALS — BP 108/68 | HR 62 | Temp 98.8°F | Ht 73.5 in | Wt 215.2 lb

## 2018-05-30 DIAGNOSIS — Z0000 Encounter for general adult medical examination without abnormal findings: Secondary | ICD-10-CM

## 2018-05-30 LAB — CBC WITH DIFFERENTIAL/PLATELET
BASOS ABS: 0 10*3/uL (ref 0.0–0.1)
Basophils Relative: 0.3 % (ref 0.0–3.0)
Eosinophils Absolute: 0.1 10*3/uL (ref 0.0–0.7)
Eosinophils Relative: 2.6 % (ref 0.0–5.0)
HCT: 39.8 % (ref 39.0–52.0)
Hemoglobin: 13.3 g/dL (ref 13.0–17.0)
Lymphocytes Relative: 40.7 % (ref 12.0–46.0)
Lymphs Abs: 0.9 10*3/uL (ref 0.7–4.0)
MCHC: 33.4 g/dL (ref 30.0–36.0)
MCV: 91.3 fl (ref 78.0–100.0)
MONOS PCT: 10.6 % (ref 3.0–12.0)
Monocytes Absolute: 0.2 10*3/uL (ref 0.1–1.0)
Neutro Abs: 1.1 10*3/uL — ABNORMAL LOW (ref 1.4–7.7)
Neutrophils Relative %: 45.8 % (ref 43.0–77.0)
Platelets: 144 10*3/uL — ABNORMAL LOW (ref 150.0–400.0)
RBC: 4.36 Mil/uL (ref 4.22–5.81)
RDW: 14.5 % (ref 11.5–15.5)
WBC: 2.3 10*3/uL — ABNORMAL LOW (ref 4.0–10.5)

## 2018-05-30 LAB — COMPREHENSIVE METABOLIC PANEL
ALBUMIN: 4.3 g/dL (ref 3.5–5.2)
ALK PHOS: 58 U/L (ref 39–117)
ALT: 21 U/L (ref 0–53)
AST: 21 U/L (ref 0–37)
BUN: 18 mg/dL (ref 6–23)
CO2: 26 mEq/L (ref 19–32)
Calcium: 9.2 mg/dL (ref 8.4–10.5)
Chloride: 108 mEq/L (ref 96–112)
Creatinine, Ser: 1.08 mg/dL (ref 0.40–1.50)
GFR: 88.79 mL/min (ref >60.00)
Glucose, Bld: 123 mg/dL — ABNORMAL HIGH (ref 70–99)
POTASSIUM: 4.4 meq/L (ref 3.5–5.1)
Sodium: 139 mEq/L (ref 135–145)
Total Bilirubin: 0.5 mg/dL (ref 0.2–1.2)
Total Protein: 7.5 g/dL (ref 6.0–8.3)

## 2018-05-30 LAB — PSA: PSA: 0.47 ng/mL (ref 0.10–4.00)

## 2018-05-30 LAB — TSH: TSH: 1.5 u[IU]/mL (ref 0.35–4.50)

## 2018-05-30 LAB — LIPID PANEL
Cholesterol: 170 mg/dL (ref 0–200)
HDL: 43.9 mg/dL (ref >39.00)
LDL Cholesterol: 112 mg/dL — ABNORMAL HIGH (ref 0–99)
NonHDL: 125.91
TRIGLYCERIDES: 70 mg/dL (ref 0.0–149.0)
Total CHOL/HDL Ratio: 4
VLDL: 14 mg/dL (ref 0.0–40.0)

## 2018-05-30 NOTE — Patient Instructions (Addendum)

## 2018-05-30 NOTE — Progress Notes (Signed)
   Subjective:    Patient ID: Troy Gonzalez, male    DOB: 1955-11-03, 62 y.o.   MRN: [id number]  HPI  62 year old patient who is seen today for a preventive health examination.  No concerns or complaints.  He takes no chronic medications and has no chronic illnesses.  No screening colonoscopy  Social history.  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  HENT: Negative for congestion, dental problem, ear pain, hearing loss, sore throat, tinnitus, trouble swallowing and voice change.   Eyes: Negative for pain, discharge and visual disturbance.  Respiratory: Negative for cough, chest tightness, wheezing and stridor.   Cardiovascular: Negative for chest pain, palpitations and leg swelling.  Gastrointestinal: Negative for abdominal distention, abdominal pain, blood in stool, constipation, diarrhea, nausea and vomiting.  Genitourinary: Negative for difficulty urinating, discharge, flank pain, genital sores, hematuria and urgency.  Musculoskeletal: Positive for back pain. Negative for arthralgias, gait problem, joint swelling, myalgias and neck stiffness.  Skin: Negative for rash.  Neurological: Negative for dizziness, syncope, speech difficulty, weakness, numbness and headaches.  Hematological: Negative for adenopathy. Does not bruise/bleed easily.  Psychiatric/Behavioral: Negative for behavioral problems and dysphoric mood. The patient is not nervous/anxious.        Objective:   Physical Exam    Constitutional: He appears well-developed and well-nourished.  Appears fit Blood pressure well controlled  HENT:  Head: Normocephalic and atraumatic.  Right Ear: External ear normal.  Left Ear: External ear normal.  Nose: Nose normal.  Mouth/Throat: Oropharynx is clear and moist.  Dentures in place  Eyes: Pupils are equal, round, and reactive to light. Conjunctivae and EOM are normal. No scleral icterus.  Neck: Normal range of motion. Neck supple. No JVD present. No thyromegaly present.  Cardiovascular: Regular rhythm, normal heart sounds and intact distal pulses. Exam reveals no gallop and no friction rub.  No murmur heard. Pulmonary/Chest: Effort normal and breath sounds normal. He exhibits no tenderness.  Abdominal: Soft. Bowel sounds are normal. He exhibits no distension and no mass. There is no tenderness.  Genitourinary: Prostate normal and penis normal. Rectal exam shows guaiac negative stool.  Genitourinary Comments: Uncircumcised  Musculoskeletal: Normal range of motion. He exhibits no edema or tenderness.  Lymphadenopathy:    He has no cervical adenopathy.  Neurological: He is alert. He has normal reflexes. No cranial nerve deficit. Coordination normal.  Skin: Skin is warm and dry. No rash noted.  Psychiatric: He has a normal mood and affect. His behavior is normal.          Assessment & Plan:   Preventive health examination.  Will check updated lab.  Schedule initial colonoscopy  Follow-up 1 year or as needed  [employer]

## 2018-05-31 LAB — HEPATITIS C ANTIBODY
Hepatitis C Ab: NONREACTIVE
SIGNAL TO CUT-OFF: 0.17 (ref <1.00)

## 2018-06-06 ENCOUNTER — Encounter: Payer: Self-pay | Admitting: Internal Medicine

## 2018-11-20 ENCOUNTER — Ambulatory Visit (INDEPENDENT_AMBULATORY_CARE_PROVIDER_SITE_OTHER): Payer: Commercial Managed Care - PPO

## 2018-11-20 ENCOUNTER — Ambulatory Visit (HOSPITAL_COMMUNITY)
Admission: EM | Admit: 2018-11-20 | Discharge: 2018-11-20 | Disposition: A | Discharge status: Home / Self Care | Payer: Commercial Managed Care - PPO | Attending: Family Medicine | Admitting: Family Medicine

## 2018-11-20 ENCOUNTER — Encounter (HOSPITAL_COMMUNITY): Payer: Self-pay

## 2018-11-20 ENCOUNTER — Other Ambulatory Visit: Payer: Self-pay

## 2018-11-20 DIAGNOSIS — M25561 Pain in right knee: Secondary | ICD-10-CM

## 2018-11-20 DIAGNOSIS — M1711 Unilateral primary osteoarthritis, right knee: Secondary | ICD-10-CM | POA: Diagnosis not present

## 2018-11-20 MED ORDER — NAPROXEN 375 MG PO TABS: 375.0000 mg | ORAL_TABLET | Freq: Two times a day (BID) | ORAL | 0 refills | Status: DC

## 2018-11-20 NOTE — ED Provider Notes (Signed)
[facility]   [id number] 11/20/18 Arrival Time: 9574  CC: RT knee pain  SUBJECTIVE: History from: patient. Troy Gonzalez is a 63 y.o. male complains of right knee pain that began a few weeks ago.  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  Localizes the pain to the outside and inside of knee  Describes the pain as intermittent and worse with working on his knees.  Has NOT tried OTC medications.  Reports hx of infection in RT knee in the past.  Complains of associated swelling.  Denies fever, chills, nausea, vomiting, erythema, ecchymosis, weakness, numbness and tingling.      ROS: As per HPI.  Past Medical History:  Diagnosis Date  . History of chicken pox    Past Surgical History:  Procedure Laterality Date  . CIRCUMCISION  09/22/2011   Procedure: CIRCUMCISION ADULT;  Surgeon: Lindaann Slough, MD;  Location: [facility];  Service: Urology;  Laterality: N/A;  . none     No Known Allergies No current facility-administered medications on file prior to encounter.    No current outpatient medications on file prior to encounter.   Social History   Socioeconomic History  . Marital status: Married    Spouse name: Not on file  . Number of children: Not on file  . Years of education: Not on file  . Highest education level: Not on file  Occupational History  . Not on file  Social Needs  . Financial resource strain: Not on file  . Food insecurity:    Worry: Not on file    Inability: Not on file  . Transportation needs:    Medical: Not on file    Non-medical: Not on file  Tobacco Use  . Smoking status: Never Smoker  . Smokeless tobacco: Never Used  Substance and Sexual Activity  . Alcohol use: No  . Drug use: No  . Sexual activity: Not on file  Lifestyle  . Physical activity:    Days per week: Not on file    Minutes per session: Not on file  . Stress: Not on file  Relationships  . Social  connections:    Talks on phone: Not on file    Gets together: Not on file    Attends religious service: Not on file    Active member of club or organization: Not on file    Attends meetings of clubs or organizations: Not on file    Relationship status: Not on file  . Intimate partner violence:    Fear of current or ex partner: Not on file    Emotionally abused: Not on file    Physically abused: Not on file    Forced sexual activity: Not on file  Other Topics Concern  . Not on file  Social History Narrative  . Not on file   Family History  Problem Relation Age of Onset  . Asthma Mother   . Kidney disease Sister   . Stroke Maternal Grandmother     OBJECTIVE:  Vitals:   11/20/18 0853 11/20/18 0855  BP: 131/82   Pulse: 68   Resp: 18   Temp: 98.3 F (36.8 C)   TempSrc: Oral   SpO2: 98%   Weight:  215 lb (97.5 kg)    General appearance: Alert; in no acute distress.  Head: NCAT Lungs: CTA bilaterally Heart: RRR.  Posterior tibialis pulse 2+ Musculoskeletal: Right knee Inspection: Skin warm, dry, clear and intact.  Mild swelling over the superior aspect of the RT knee Palpation: TTP over medial joint line ROM: FROM active and passive Strength: 5/5 knee abduction, 5/5 knee adduction, 5/5 knee flexion, 5/5 knee extension; equal bilaterally Stability: Anterior/ posterior drawer intact Skin: warm and dry Neurologic: Ambulates without difficulty; Sensation intact about the lower extremities Psychological: alert and cooperative; normal mood and affect  DIAGNOSTIC STUDIES  Dg Knee Complete 4 Views Right  Result Date: 11/20/2018 CLINICAL DATA:  Medial right knee pain for 3 weeks. No reported injury. EXAM: RIGHT KNEE - COMPLETE 4+ VIEW COMPARISON:  11/26/2003 right knee radiograph FINDINGS: Small suprapatellar right knee joint effusion. No fracture or dislocation. No suspicious focal osseous lesions. Mild-to-moderate tricompartmental osteoarthritis, most prominent in the medial  compartment, with interval progression. Small superior right patellar enthesophyte. IMPRESSION: 1. Small suprapatellar right knee joint effusion. No fracture or dislocation. 2. Mild-to-moderate tricompartmental right knee osteoarthritis, most prominent in the medial compartment, progressed since 2005 radiographs. Electronically Signed   By: Delbert Phenix M.D.   On: 11/20/2018 09:50     ASSESSMENT & PLAN:  1. Primary osteoarthritis of right knee   2. Acute pain of right knee     Meds ordered this encounter  Medications  . naproxen (NAPROSYN) 375 MG tablet    Sig: Take 1 tablet (375 mg total) by mouth 2 (two) times daily.    Dispense:  20 tablet    Refill:  0    Order Specific Question:   Supervising Provider    Answer:   Eustace Moore [[id number]]   X-rays showed osteoarthritis Knee brace given Continue conservative management of rest, ice, elevation, and gentle stretches Take naproxen as needed for pain relief (may cause abdominal discomfort, ulcers, and GI bleeds avoid taking with other NSAIDs) Follow up with orthopedist for further evaluation and management Return or go to the ER if you have any new or worsening symptoms (fever, chills, chest pain, abdominal pain, changes in bowel or bladder habits, pain radiating into lower legs, etc...)   Reviewed expectations re: course of current medical issues. Questions answered. Outlined signs and symptoms indicating need for more acute intervention. Patient verbalized understanding. After Visit Summary given.    Rennis Harding, PA-C 11/20/18 1016

## 2018-11-20 NOTE — ED Triage Notes (Signed)
Pt cc right knee pain x 2 weeks. Pt states the pain is off and on.

## 2018-11-20 NOTE — Discharge Instructions (Signed)
X-rays showed osteoarthritis Knee brace given Continue conservative management of rest, ice, elevation, and gentle stretches Take naproxen as needed for pain relief (may cause abdominal discomfort, ulcers, and GI bleeds avoid taking with other NSAIDs) Follow up with orthopedist for further evaluation and management Return or go to the ER if you have any new or worsening symptoms (fever, chills, chest pain, abdominal pain, changes in bowel or bladder habits, pain radiating into lower legs, etc...)

## 2019-06-13 IMAGING — DX DG CHEST 2V
2 series · 2 of 2 positions shown · non-contrast
Comparison: 09/22/2014

CLINICAL DATA: Cough and fever.  Diagnosed with the flu

EXAM:
CHEST  2 VIEW

[chest pa]
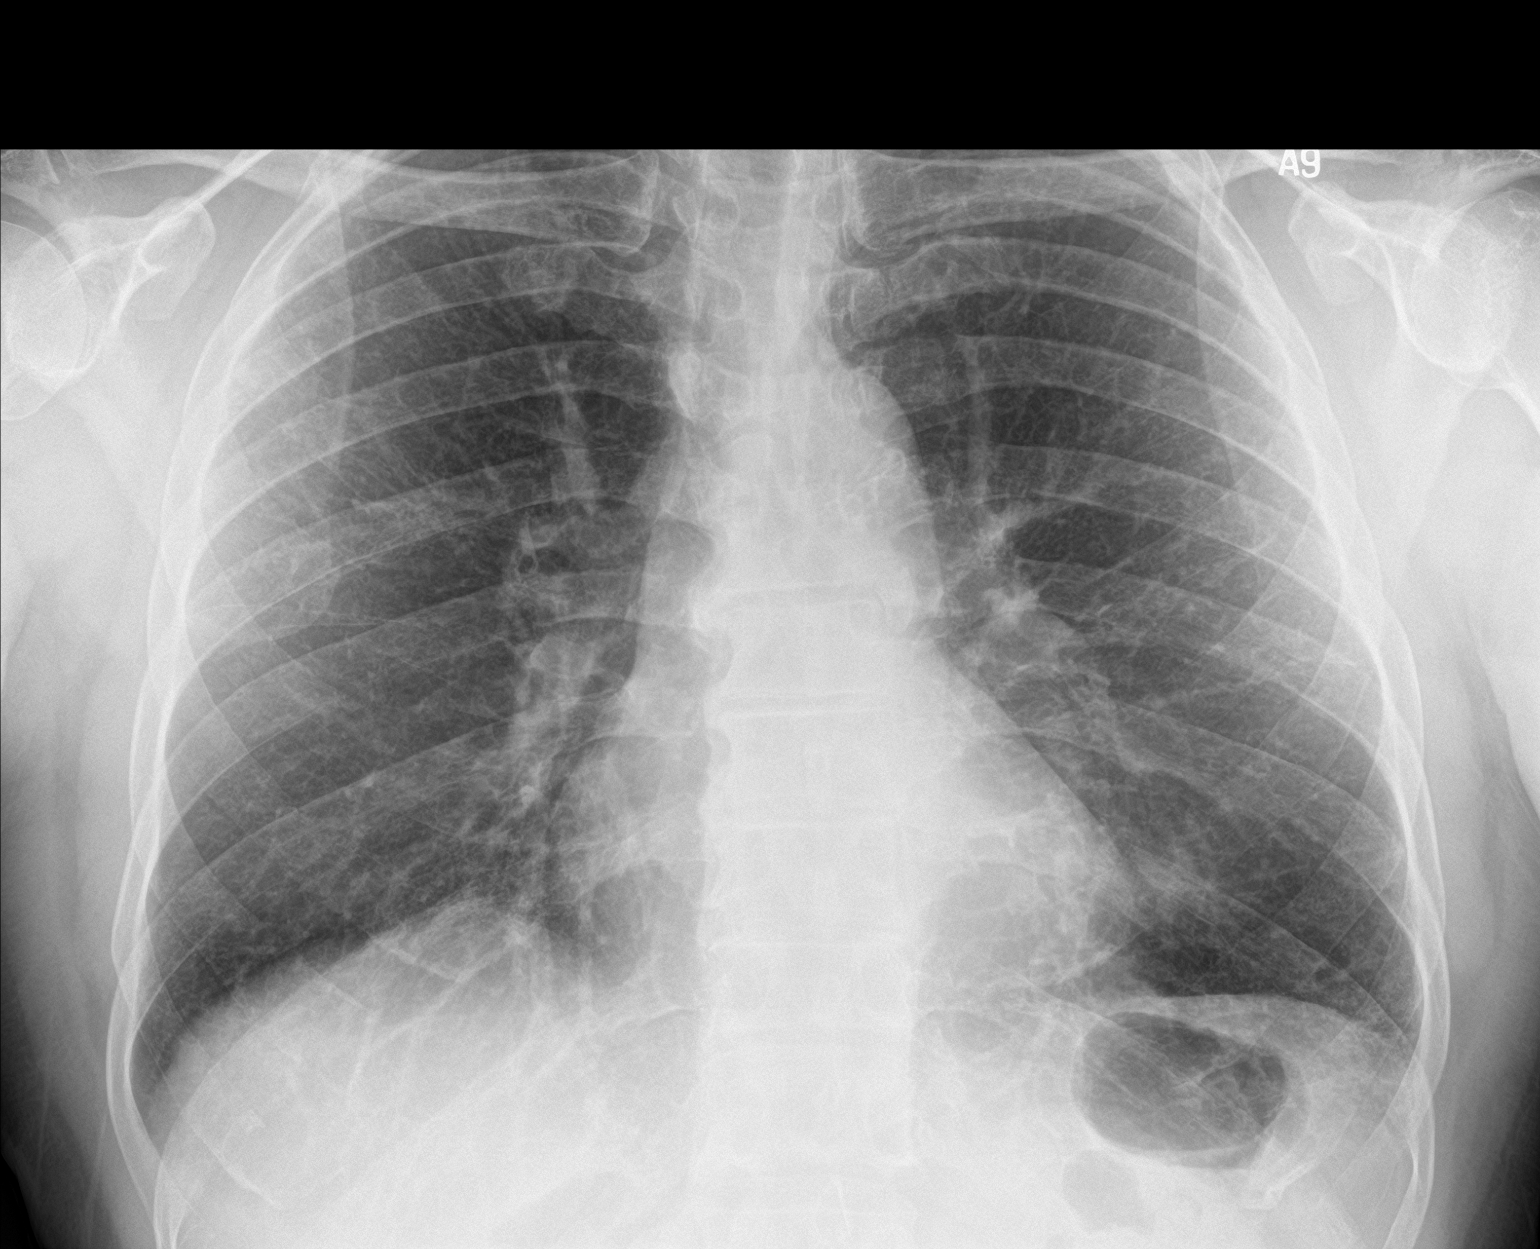

[chest lat]
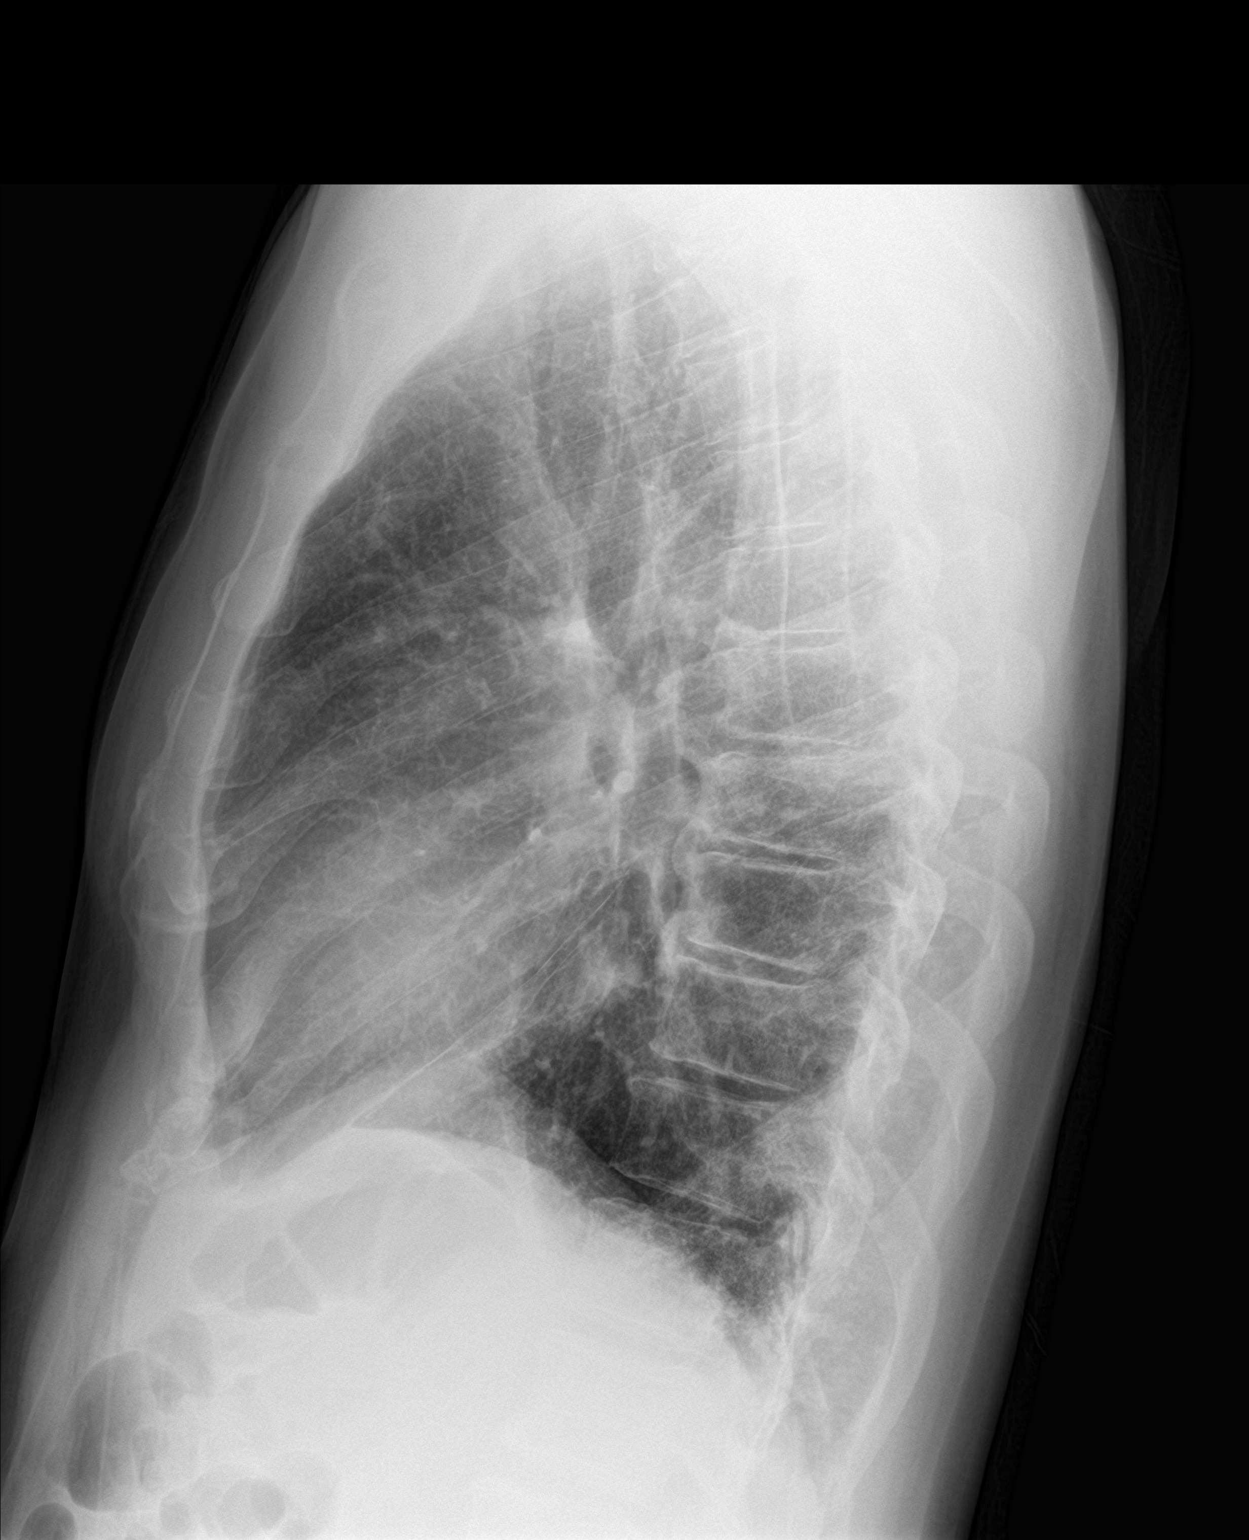

[2 of 2 positions shown; findings below may reference images not displayed]

FINDINGS: Normal heart size and mediastinal contours. Probable increase in
interstitial markings when compared to prior, especially noted on
the lateral. No collapse or consolidation. Normal heart size and
mediastinal contours. Spondylosis.
IMPRESSION: 1. Negative for pneumonia.
2. Borderline interstitial/bronchitic opacity when compared to 6632.

## 2019-08-13 ENCOUNTER — Other Ambulatory Visit: Payer: Self-pay

## 2019-08-14 ENCOUNTER — Encounter: Payer: Self-pay | Admitting: Family Medicine

## 2019-08-14 ENCOUNTER — Ambulatory Visit (INDEPENDENT_AMBULATORY_CARE_PROVIDER_SITE_OTHER): Payer: Commercial Managed Care - PPO | Admitting: Family Medicine

## 2019-08-14 VITALS — BP 110/78 | HR 72 | Temp 98.1°F | Wt 218.0 lb

## 2019-08-14 DIAGNOSIS — Z125 Encounter for screening for malignant neoplasm of prostate: Secondary | ICD-10-CM | POA: Diagnosis not present

## 2019-08-14 DIAGNOSIS — Z0000 Encounter for general adult medical examination without abnormal findings: Secondary | ICD-10-CM | POA: Diagnosis not present

## 2019-08-14 DIAGNOSIS — Z23 Encounter for immunization: Secondary | ICD-10-CM

## 2019-08-14 DIAGNOSIS — Z13220 Encounter for screening for lipoid disorders: Secondary | ICD-10-CM

## 2019-08-14 DIAGNOSIS — Z131 Encounter for screening for diabetes mellitus: Secondary | ICD-10-CM | POA: Diagnosis not present

## 2019-08-14 NOTE — Addendum Note (Signed)
Addended by: Wyvonne Lenz on: 08/14/2019 01:49 PM   Modules accepted: Orders

## 2019-08-14 NOTE — Patient Instructions (Signed)
Preventive Care 40-64 Years Old, Male Preventive care refers to lifestyle choices and visits with your health care provider that can promote health and wellness. This includes:  A yearly physical exam. This is also called an annual well check.  Regular dental and eye exams.  Immunizations.  Screening for certain conditions.  Healthy lifestyle choices, such as eating a healthy diet, getting regular exercise, not using drugs or products that contain nicotine and tobacco, and limiting alcohol use. What can I expect for my preventive care visit? Physical exam Your health care provider will check:  Height and weight. These may be used to calculate body mass index (BMI), which is a measurement that tells if you are at a healthy weight.  Heart rate and blood pressure.  Your skin for abnormal spots. Counseling Your health care provider may ask you questions about:  Alcohol, tobacco, and drug use.  Emotional well-being.  Home and relationship well-being.  Sexual activity.  Eating habits.  Work and work environment. What immunizations do I need?  Influenza (flu) vaccine  This is recommended every year. Tetanus, diphtheria, and pertussis (Tdap) vaccine  You may need a Td booster every 10 years. Varicella (chickenpox) vaccine  You may need this vaccine if you have not already been vaccinated. Zoster (shingles) vaccine  You may need this after age 60. Measles, mumps, and rubella (MMR) vaccine  You may need at least one dose of MMR if you were born in 1957 or later. You may also need a second dose. Pneumococcal conjugate (PCV13) vaccine  You may need this if you have certain conditions and were not previously vaccinated. Pneumococcal polysaccharide (PPSV23) vaccine  You may need one or two doses if you smoke cigarettes or if you have certain conditions. Meningococcal conjugate (MenACWY) vaccine  You may need this if you have certain conditions. Hepatitis A vaccine   You may need this if you have certain conditions or if you travel or work in places where you may be exposed to hepatitis A. Hepatitis B vaccine  You may need this if you have certain conditions or if you travel or work in places where you may be exposed to hepatitis B. Haemophilus influenzae type b (Hib) vaccine  You may need this if you have certain risk factors. Human papillomavirus (HPV) vaccine  If recommended by your health care provider, you may need three doses over 6 months. You may receive vaccines as individual doses or as more than one vaccine together in one shot (combination vaccines). Talk with your health care provider about the risks and benefits of combination vaccines. What tests do I need? Blood tests  Lipid and cholesterol levels. These may be checked every 5 years, or more frequently if you are over 50 years old.  Hepatitis C test.  Hepatitis B test. Screening  Lung cancer screening. You may have this screening every year starting at age 55 if you have a 30-pack-year history of smoking and currently smoke or have quit within the past 15 years.  Prostate cancer screening. Recommendations will vary depending on your family history and other risks.  Colorectal cancer screening. All adults should have this screening starting at age 50 and continuing until age 75. Your health care provider may recommend screening at age 45 if you are at increased risk. You will have tests every 1-10 years, depending on your results and the type of screening test.  Diabetes screening. This is done by checking your blood sugar (glucose) after you have not eaten   for a while (fasting). You may have this done every 1-3 years.  Sexually transmitted disease (STD) testing. Follow these instructions at home: Eating and drinking  Eat a diet that includes fresh fruits and vegetables, whole grains, lean protein, and low-fat dairy products.  Take vitamin and mineral supplements as recommended  by your health care provider.  Do not drink alcohol if your health care provider tells you not to drink.  If you drink alcohol: ? Limit how much you have to 0-2 drinks a day. ? Be aware of how much alcohol is in your drink. In the [place] one drink equals one 12 oz bottle of beer (355 mL), one 5 oz glass of wine (148 mL), or one 1 oz glass of hard liquor (44 mL). Lifestyle  Take daily care of your teeth and gums.  Stay active. Exercise for at least 30 minutes on 5 or more days each week.  Do not use any products that contain nicotine or tobacco, such as cigarettes, e-cigarettes, and chewing tobacco. If you need help quitting, ask your health care provider.  If you are sexually active, practice safe sex. Use a condom or other form of protection to prevent STIs (sexually transmitted infections).  Talk with your health care provider about taking a low-dose aspirin every day starting at age 33. What's next?  Go to your health care provider once a year for a well check visit.  Ask your health care provider how often you should have your eyes and teeth checked.  Stay up to date on all vaccines. This information is not intended to replace advice given to you by your health care provider. Make sure you discuss any questions you have with your health care provider. Document Released: 10/08/2015 Document Revised: 09/05/2018 Document Reviewed: 09/05/2018 Elsevier Patient Education  2020 [employer].

## 2019-08-14 NOTE — Progress Notes (Signed)
Subjective:     Troy Gonzalez is a 63 y.o. male and is here for a comprehensive physical exam and TOC, previously seen by Dr. Burnice Logan. The patient reports no problems.  Patient states he is healthy and not on any medications.  Patient likes to stay active.  States needs to increase water intake daily.  Social History   Socioeconomic History  . Marital status: Married    Spouse name: Not on file  . Number of children: Not on file  . Years of education: Not on file  . Highest education level: Not on file  Occupational History  . Not on file  Social Needs  . Financial resource strain: Not on file  . Food insecurity    Worry: Not on file    Inability: Not on file  . Transportation needs    Medical: Not on file    Non-medical: Not on file  Tobacco Use  . Smoking status: Never Smoker  . Smokeless tobacco: Never Used  Substance and Sexual Activity  . Alcohol use: No  . Drug use: No  . Sexual activity: Not on file  Lifestyle  . Physical activity    Days per week: Not on file    Minutes per session: Not on file  . Stress: Not on file  Relationships  . Social Herbalist on phone: Not on file    Gets together: Not on file    Attends religious service: Not on file    Active member of club or organization: Not on file    Attends meetings of clubs or organizations: Not on file    Relationship status: Not on file  . Intimate partner violence    Fear of current or ex partner: Not on file    Emotionally abused: Not on file    Physically abused: Not on file    Forced sexual activity: Not on file  Other Topics Concern  . Not on file  Social History Narrative  . Not on file   Health Maintenance  Topic Date Due  . HIV Screening  05/06/1970  . COLONOSCOPY  05/06/2005  . INFLUENZA VACCINE  04/26/2019  . TETANUS/TDAP  05/24/2025  . Hepatitis C Screening  Completed    The following portions of the patient's history were reviewed and updated as appropriate:  allergies, current medications, past family history, past medical history, past social history, past surgical history and problem list.  Review of Systems A comprehensive review of systems was negative.   Objective:    BP 110/78 (BP Location: Left Arm, Patient Position: Sitting, Cuff Size: Large)   Pulse 72   Temp 98.1 F (36.7 C) (Temporal)   Wt 218 lb (98.9 kg)   SpO2 98%   BMI 28.37 kg/m  General appearance: alert, cooperative and no distress Head: Normocephalic, without obvious abnormality, atraumatic Eyes: conjunctivae/corneas clear. PERRL, EOM's intact. Fundi benign. Ears: normal TM's and external ear canals both ears Nose: Nares normal. Septum midline. Mucosa normal. No drainage or sinus tenderness. Throat: lips, mucosa, and tongue normal; teeth and gums normal Neck: no adenopathy, no carotid bruit, no JVD, supple, symmetrical, trachea midline and thyroid not enlarged, symmetric, no tenderness/mass/nodules Lungs: clear to auscultation bilaterally Heart: regular rate and rhythm, S1, S2 normal, no murmur, click, rub or gallop Abdomen: soft, non-tender; bowel sounds normal; no masses,  no organomegaly Extremities: extremities normal, atraumatic, no cyanosis or edema Pulses: 2+ and symmetric Skin: Skin color, texture, turgor normal. No rashes or  lesions Lymph nodes: Cervical, supraclavicular, and axillary nodes normal. Neurologic: Alert and oriented X 3, normal strength and tone. Normal symmetric reflexes. Normal coordination and gait    Assessment:    Healthy male exam.      Plan:     Anticipatory guidance given including wearing seatbelts, smoke detectors in the home, increasing physical activity, increasing p.o. intake of water and vegetables. -We will obtain labs.  Patient to return next week for labs when fasting -pt to consider colonoscopy -Given handout -Next CPE in 1 year See After Visit Summary for Counseling Recommendations    Need for influenza  vaccination -Influenza vaccine given this visit  Screening for cholesterol level  - Plan: Lipid Panel  Screening for prostate cancer  - Plan: PSA  Screening for diabetes mellitus  - Plan: Hemoglobin A1c  Follow-up as needed  Abbe Amsterdam, MD

## 2019-08-20 ENCOUNTER — Telehealth: Payer: Self-pay | Admitting: Family Medicine

## 2019-08-20 ENCOUNTER — Encounter: Payer: Self-pay | Admitting: Family Medicine

## 2019-08-20 ENCOUNTER — Other Ambulatory Visit (INDEPENDENT_AMBULATORY_CARE_PROVIDER_SITE_OTHER): Payer: Commercial Managed Care - PPO

## 2019-08-20 ENCOUNTER — Other Ambulatory Visit: Payer: Self-pay

## 2019-08-20 DIAGNOSIS — E119 Type 2 diabetes mellitus without complications: Secondary | ICD-10-CM | POA: Insufficient documentation

## 2019-08-20 DIAGNOSIS — Z131 Encounter for screening for diabetes mellitus: Secondary | ICD-10-CM

## 2019-08-20 DIAGNOSIS — Z125 Encounter for screening for malignant neoplasm of prostate: Secondary | ICD-10-CM

## 2019-08-20 DIAGNOSIS — Z0000 Encounter for general adult medical examination without abnormal findings: Secondary | ICD-10-CM

## 2019-08-20 DIAGNOSIS — Z13220 Encounter for screening for lipoid disorders: Secondary | ICD-10-CM | POA: Diagnosis not present

## 2019-08-20 LAB — BASIC METABOLIC PANEL
BUN: 11 mg/dL (ref 6–23)
CO2: 27 mEq/L (ref 19–32)
Calcium: 9.1 mg/dL (ref 8.4–10.5)
Chloride: 106 mEq/L (ref 96–112)
Creatinine, Ser: 1.09 mg/dL (ref 0.40–1.50)
GFR: 82.33 mL/min (ref >60.00)
Glucose, Bld: 106 mg/dL — ABNORMAL HIGH (ref 70–99)
Potassium: 4.3 mEq/L (ref 3.5–5.1)
Sodium: 141 mEq/L (ref 135–145)

## 2019-08-20 LAB — CBC WITH DIFFERENTIAL/PLATELET
Basophils Absolute: 0 10*3/uL (ref 0.0–0.1)
Basophils Relative: 0.3 % (ref 0.0–3.0)
Eosinophils Absolute: 0.1 10*3/uL (ref 0.0–0.7)
Eosinophils Relative: 3.4 % (ref 0.0–5.0)
HCT: 35.6 % — ABNORMAL LOW (ref 39.0–52.0)
Hemoglobin: 11.7 g/dL — ABNORMAL LOW (ref 13.0–17.0)
Lymphocytes Relative: 43.5 % (ref 12.0–46.0)
Lymphs Abs: 1.3 10*3/uL (ref 0.7–4.0)
MCHC: 32.9 g/dL (ref 30.0–36.0)
MCV: 93.7 fl (ref 78.0–100.0)
Monocytes Absolute: 0.4 10*3/uL (ref 0.1–1.0)
Monocytes Relative: 12.6 % — ABNORMAL HIGH (ref 3.0–12.0)
Neutro Abs: 1.2 10*3/uL — ABNORMAL LOW (ref 1.4–7.7)
Neutrophils Relative %: 40.2 % — ABNORMAL LOW (ref 43.0–77.0)
Platelets: 154 10*3/uL (ref 150.0–400.0)
RBC: 3.8 Mil/uL — ABNORMAL LOW (ref 4.22–5.81)
RDW: 14.8 % (ref 11.5–15.5)
WBC: 3 10*3/uL — ABNORMAL LOW (ref 4.0–10.5)

## 2019-08-20 LAB — LIPID PANEL
Cholesterol: 187 mg/dL (ref 0–200)
HDL: 44.2 mg/dL (ref >39.00)
LDL Cholesterol: 132 mg/dL — ABNORMAL HIGH (ref 0–99)
NonHDL: 143.06
Total CHOL/HDL Ratio: 4
Triglycerides: 54 mg/dL (ref 0.0–149.0)
VLDL: 10.8 mg/dL (ref 0.0–40.0)

## 2019-08-20 LAB — PSA: PSA: 0.89 ng/mL (ref 0.10–4.00)

## 2019-08-20 LAB — HEMOGLOBIN A1C: Hgb A1c MFr Bld: 6.7 % — ABNORMAL HIGH (ref 4.6–6.5)

## 2019-08-20 NOTE — Telephone Encounter (Signed)
Left message for patient to return call for lab results. 

## 2019-08-20 NOTE — Telephone Encounter (Signed)
Copied from [place] [id number]. Topic: Quick Communication - Lab Results (Clinic Use ONLY) >> Aug 20, 2019  5:19 PM [place], Troy Gonzalez, CMA wrote: Called patient to inform them of  lab results. When patient returns call, triage nurse may disclose results.

## 2020-09-22 ENCOUNTER — Other Ambulatory Visit: Payer: Self-pay

## 2020-09-23 ENCOUNTER — Encounter: Payer: Self-pay | Admitting: Family Medicine

## 2020-09-23 ENCOUNTER — Other Ambulatory Visit: Payer: Self-pay

## 2020-09-23 ENCOUNTER — Ambulatory Visit (INDEPENDENT_AMBULATORY_CARE_PROVIDER_SITE_OTHER): Payer: Commercial Managed Care - PPO | Admitting: Family Medicine

## 2020-09-23 VITALS — BP 126/80 | HR 71 | Temp 98.8°F | Wt 221.1 lb

## 2020-09-23 DIAGNOSIS — Z125 Encounter for screening for malignant neoplasm of prostate: Secondary | ICD-10-CM

## 2020-09-23 DIAGNOSIS — Z0000 Encounter for general adult medical examination without abnormal findings: Secondary | ICD-10-CM

## 2020-09-23 DIAGNOSIS — Z1211 Encounter for screening for malignant neoplasm of colon: Secondary | ICD-10-CM | POA: Diagnosis not present

## 2020-09-23 DIAGNOSIS — Z23 Encounter for immunization: Secondary | ICD-10-CM

## 2020-09-23 DIAGNOSIS — Z13220 Encounter for screening for lipoid disorders: Secondary | ICD-10-CM

## 2020-09-23 LAB — BASIC METABOLIC PANEL
BUN: 16 mg/dL (ref 6–23)
CO2: 25 mEq/L (ref 19–32)
Calcium: 8.8 mg/dL (ref 8.4–10.5)
Chloride: 107 mEq/L (ref 96–112)
Creatinine, Ser: 1.09 mg/dL (ref 0.40–1.50)
GFR: 71.28 mL/min (ref >60.00)
Glucose, Bld: 113 mg/dL — ABNORMAL HIGH (ref 70–99)
Potassium: 4.2 mEq/L (ref 3.5–5.1)
Sodium: 139 mEq/L (ref 135–145)

## 2020-09-23 LAB — CBC WITH DIFFERENTIAL/PLATELET
Basophils Absolute: 0 10*3/uL (ref 0.0–0.1)
Basophils Relative: 0.4 % (ref 0.0–3.0)
Eosinophils Absolute: 0.1 10*3/uL (ref 0.0–0.7)
Eosinophils Relative: 3.6 % (ref 0.0–5.0)
HCT: 39.4 % (ref 39.0–52.0)
Hemoglobin: 12.9 g/dL — ABNORMAL LOW (ref 13.0–17.0)
Lymphocytes Relative: 37.8 % (ref 12.0–46.0)
Lymphs Abs: 0.9 10*3/uL (ref 0.7–4.0)
MCHC: 32.8 g/dL (ref 30.0–36.0)
MCV: 92.4 fl (ref 78.0–100.0)
Monocytes Absolute: 0.2 10*3/uL (ref 0.1–1.0)
Monocytes Relative: 9.9 % (ref 3.0–12.0)
Neutro Abs: 1.2 10*3/uL — ABNORMAL LOW (ref 1.4–7.7)
Neutrophils Relative %: 48.3 % (ref 43.0–77.0)
Platelets: 135 10*3/uL — ABNORMAL LOW (ref 150.0–400.0)
RBC: 4.27 Mil/uL (ref 4.22–5.81)
RDW: 14.3 % (ref 11.5–15.5)
WBC: 2.5 10*3/uL — ABNORMAL LOW (ref 4.0–10.5)

## 2020-09-23 LAB — LIPID PANEL
Cholesterol: 186 mg/dL (ref 0–200)
HDL: 45.3 mg/dL (ref >39.00)
LDL Cholesterol: 128 mg/dL — ABNORMAL HIGH (ref 0–99)
NonHDL: 140.99
Total CHOL/HDL Ratio: 4
Triglycerides: 65 mg/dL (ref 0.0–149.0)
VLDL: 13 mg/dL (ref 0.0–40.0)

## 2020-09-23 LAB — HEMOGLOBIN A1C: Hgb A1c MFr Bld: 6.7 % — ABNORMAL HIGH (ref 4.6–6.5)

## 2020-09-23 LAB — PSA: PSA: 0.66 ng/mL (ref 0.10–4.00)

## 2020-09-23 NOTE — Progress Notes (Signed)
Subjective:     Troy Gonzalez is a 64 y.o. male and is here for a comprehensive physical exam. The patient reports no problems.  Patient is not currently on any medications.  Patient states he is staying busy at work.  Patient has never had a colonoscopy.  Considering having this done.  Patient had Pfizer COVID-19 vaccine on 3/24, 4/14, and booster on 07/29/2020.  Social History   Socioeconomic History  . Marital status: Married    Spouse name: Not on file  . Number of children: Not on file  . Years of education: Not on file  . Highest education level: Not on file  Occupational History  . Not on file  Tobacco Use  . Smoking status: Never Smoker  . Smokeless tobacco: Never Used  Substance and Sexual Activity  . Alcohol use: No  . Drug use: No  . Sexual activity: Not on file  Other Topics Concern  . Not on file  Social History Narrative  . Not on file   Social Determinants of Health   Financial Resource Strain: Not on file  Food Insecurity: Not on file  Transportation Needs: Not on file  Physical Activity: Not on file  Stress: Not on file  Social Connections: Not on file  Intimate Partner Violence: Not on file   Health Maintenance  Topic Date Due  . FOOT EXAM  Never done  . OPHTHALMOLOGY EXAM  Never done  . URINE MICROALBUMIN  Never done  . HIV Screening  Never done  . COLONOSCOPY (Pts 45-54yrs Insurance coverage will need to be confirmed)  Never done  . HEMOGLOBIN A1C  02/17/2020  . PNA vac Low Risk Adult (1 of 2 - PCV13) Never done  . TETANUS/TDAP  05/24/2025  . INFLUENZA VACCINE  Completed  . COVID-19 Vaccine  Completed  . Hepatitis C Screening  Completed    The following portions of the patient's history were reviewed and updated as appropriate: allergies, current medications, past family history, past medical history, past social history, past surgical history and problem list.  Review of Systems A comprehensive review of systems was negative.    Objective:    BP 126/80 (BP Location: Left Arm, Patient Position: Sitting, Cuff Size: Normal)   Pulse 71   Temp 98.8 F (37.1 C) (Oral)   Wt 221 lb 2.2 oz (100.3 kg)   SpO2 98%   BMI 28.78 kg/m  General appearance: alert, cooperative and no distress Head: Normocephalic, without obvious abnormality, atraumatic Eyes: conjunctivae/corneas clear. PERRL, EOM's intact. Fundi benign. Ears: normal TM's and external ear canals both ears Nose: Nares normal. Septum midline. Mucosa normal. No drainage or sinus tenderness. Throat: lips, mucosa, and tongue normal; teeth and gums normal Neck: no adenopathy, no carotid bruit, no JVD, supple, symmetrical, trachea midline and thyroid not enlarged, symmetric, no tenderness/mass/nodules Lungs: clear to auscultation bilaterally Heart: regular rate and rhythm, S1, S2 normal, no murmur, click, rub or gallop Abdomen: soft, non-tender; bowel sounds normal; no masses,  no organomegaly Extremities: extremities normal, atraumatic, no cyanosis or edema Pulses: 2+ and symmetric Skin: Skin color, texture, turgor normal. No rashes or lesions Lymph nodes: Cervical, supraclavicular, and axillary nodes normal. Neurologic: Alert and oriented X 3, normal strength and tone. Normal symmetric reflexes. Normal coordination and gait    Assessment:    Healthy 64 year old male with no significant pmh.      Plan:     Well adult exam -Anticipatory guidance given including wearing seatbelts, smoke detectors in the  home, increasing physical activity, increasing p.o. intake of water and vegetables. -We will obtain labs -Discussed colonoscopy.  Patient agrees -Discussed immunizations.  COVID vaccines up-to-date.  Pt agrees to influenza vaccine this visit.  Patient to consider pneumonia vaccine and shingles. -Given handout -Next CPE in 1 year -See after visit summary for counseling recommendations - Plan: Basic metabolic panel, CBC with Differential/Platelet, CBC with  Differential/Platelet, Basic metabolic panel  Need for immunization against influenza - Plan: Flu Vaccine QUAD High Dose(Fluad)  Colon cancer screening - Plan: Ambulatory referral to Gastroenterology  Screening for cholesterol level - Plan: Hemoglobin A1c, Lipid panel  Prostate cancer screening - Plan: PSA  Follow-up as needed  Abbe Amsterdam, MD

## 2020-09-23 NOTE — Patient Instructions (Signed)
Preventive Care 65 Years and Older, Male Preventive care refers to lifestyle choices and visits with your health care provider that can promote health and wellness. This includes:  A yearly physical exam. This is also called an annual well check.  Regular dental and eye exams.  Immunizations.  Screening for certain conditions.  Healthy lifestyle choices, such as diet and exercise. What can I expect for my preventive care visit? Physical exam Your health care provider will check:  Height and weight. These may be used to calculate body mass index (BMI), which is a measurement that tells if you are at a healthy weight.  Heart rate and blood pressure.  Your skin for abnormal spots. Counseling Your health care provider may ask you questions about:  Alcohol, tobacco, and drug use.  Emotional well-being.  Home and relationship well-being.  Sexual activity.  Eating habits.  History of falls.  Memory and ability to understand (cognition).  Work and work Statistician. What immunizations do I need?  Influenza (flu) vaccine  This is recommended every year. Tetanus, diphtheria, and pertussis (Tdap) vaccine  You may need a Td booster every 10 years. Varicella (chickenpox) vaccine  You may need this vaccine if you have not already been vaccinated. Zoster (shingles) vaccine  You may need this after age 65. Pneumococcal conjugate (PCV13) vaccine  One dose is recommended after age 40. Pneumococcal polysaccharide (PPSV23) vaccine  One dose is recommended after age 24. Measles, mumps, and rubella (MMR) vaccine  You may need at least one dose of MMR if you were born in 1957 or later. You may also need a second dose. Meningococcal conjugate (MenACWY) vaccine  You may need this if you have certain conditions. Hepatitis A vaccine  You may need this if you have certain conditions or if you travel or work in places where you may be exposed to hepatitis A. Hepatitis B  vaccine  You may need this if you have certain conditions or if you travel or work in places where you may be exposed to hepatitis B. Haemophilus influenzae type b (Hib) vaccine  You may need this if you have certain conditions. You may receive vaccines as individual doses or as more than one vaccine together in one shot (combination vaccines). Talk with your health care provider about the risks and benefits of combination vaccines. What tests do I need? Blood tests  Lipid and cholesterol levels. These may be checked every 5 years, or more frequently depending on your overall health.  Hepatitis C test.  Hepatitis B test. Screening  Lung cancer screening. You may have this screening every year starting at age 65 if you have a 30-pack-year history of smoking and currently smoke or have quit within the past 15 years.  Colorectal cancer screening. All adults should have this screening starting at age 65 and continuing until age 65. Your health care provider may recommend screening at age 65 if you are at increased risk. You will have tests every 1-10 years, depending on your results and the type of screening test.  Prostate cancer screening. Recommendations will vary depending on your family history and other risks.  Diabetes screening. This is done by checking your blood sugar (glucose) after you have not eaten for a while (fasting). You may have this done every 1-3 years.  Abdominal aortic aneurysm (AAA) screening. You may need this if you are a current or former smoker.  Sexually transmitted disease (STD) testing. Follow these instructions at home: Eating and drinking  Eat  a diet that includes fresh fruits and vegetables, whole grains, lean protein, and low-fat dairy products. Limit your intake of foods with high amounts of sugar, saturated fats, and salt.  Take vitamin and mineral supplements as recommended by your health care provider.  Do not drink alcohol if your health care  provider tells you not to drink.  If you drink alcohol: ? Limit how much you have to 0-2 drinks a day. ? Be aware of how much alcohol is in your drink. In the [place] one drink equals one 12 oz bottle of beer (355 mL), one 5 oz glass of wine (148 mL), or one 1 oz glass of hard liquor (44 mL). Lifestyle  Take daily care of your teeth and gums.  Stay active. Exercise for at least 30 minutes on 5 or more days each week.  Do not use any products that contain nicotine or tobacco, such as cigarettes, e-cigarettes, and chewing tobacco. If you need help quitting, ask your health care provider.  If you are sexually active, practice safe sex. Use a condom or other form of protection to prevent STIs (sexually transmitted infections).  Talk with your health care provider about taking a low-dose aspirin or statin. What's next?  Visit your health care provider once a year for a well check visit.  Ask your health care provider how often you should have your eyes and teeth checked.  Stay up to date on all vaccines. This information is not intended to replace advice given to you by your health care provider. Make sure you discuss any questions you have with your health care provider. Document Revised: 09/05/2018 Document Reviewed: 09/05/2018 Elsevier Patient Education  2020 [place] Screening  Colorectal cancer screening is a group of tests that are used to check for colorectal cancer before symptoms develop. Colorectal refers to the colon and rectum. The colon and rectum are located at the end of the digestive tract and carry bowel movements out of the body. Who should have screening? All adults starting at age 65 until age 65 should have screening. Your health care provider may recommend screening at age 65. You will have tests every 1-10 years, depending on your results and the type of screening test. You may have screening tests starting at an earlier age, or more  frequently than other people, if you have any of the following risk factors:  A personal or family history of colorectal cancer or abnormal growths (polyps).  Inflammatory bowel disease, such as ulcerative colitis or Crohn's disease.  A history of having radiation treatment to the abdomen or pelvic area for cancer.  Colorectal cancer symptoms, such as changes in bowel habits or blood in your stool.  A type of colon cancer syndrome that is passed from parent to child (hereditary), such as: ? Lynch syndrome. ? Familial adenomatous polyposis. ? Turcot syndrome. ? Peutz-Jeghers syndrome. Screening recommendations for adults who are 50-29 years old vary depending on health. How is screening done? There are several types of colorectal screening tests. You may have one or more of the following:  Guaiac-based fecal occult blood testing. For this test, a stool (feces) sample is checked for hidden (occult) blood, which could be a sign of colorectal cancer.  Fecal immunochemical test (FIT). For this test, a stool sample is checked for blood, which could be a sign of colorectal cancer.  Stool DNA test. For this test, a stool sample is checked for blood and changes in DNA that could  lead to colorectal cancer.  Sigmoidoscopy. During this test, a thin, flexible tube with a camera on the end (sigmoidoscope) is used to examine the rectum and the lower colon.  Colonoscopy. During this test, a long, flexible tube with a camera on the end (colonoscope) is used to examine the entire colon and rectum. With a colonoscopy, it is possible to take a sample of tissue (biopsy) and remove small polyps during the test.  Virtual colonoscopy. Instead of a colonoscope, this type of colonoscopy uses X-rays (CT scan) and computers to produce images of the colon and rectum. What are the benefits of screening? Screening reduces your risk for colorectal cancer and can help identify cancer at an early stage, when the cancer  can be removed or treated more easily. It is common for polyps to form in the lining of the colon, especially as you age. These polyps may be cancerous or become cancerous over time. Screening can identify these polyps. What are the risks of screening? Each screening test may have different risks.  Stool sample tests have fewer risks than other types of screening tests. However, you may need more tests to confirm results from a stool sample test.  Screening tests that involve X-rays expose you to low levels of radiation, which may slightly increase your cancer risk. The benefit of detecting cancer outweighs the slight increase in risk.  Screening tests such as sigmoidoscopy and colonoscopy may place you at risk for bleeding, intestinal damage, infection, or a reaction to medicines given during the exam. Talk with your health care provider to understand your risk for colorectal cancer and to make a screening plan that is right for you. Questions to ask your health care provider  When should I start colorectal cancer screening?  What is my risk for colorectal cancer?  How often do I need screening?  Which screening tests do I need?  How do I get my test results?  What do my results mean? Where to find more information Learn more about colorectal cancer screening from:  The [employer]: www.cancer.org  The [employer]: www.cancer.gov Summary  Colorectal cancer screening is a group of tests used to check for colorectal cancer before symptoms develop.  Screening reduces your risk for colorectal cancer and can help identify cancer at an early stage, when the cancer can be removed or treated more easily.  All adults starting at age 73 until age 55 should have screening. Your health care provider may recommend screening at age 79.  You may have screening tests starting at an earlier age, or more frequently than other people, if you have certain risk  factors.  Talk with your health care provider to understand your risk for colorectal cancer and to make a screening plan that is right for you. This information is not intended to replace advice given to you by your health care provider. Make sure you discuss any questions you have with your health care provider. Document Revised: 01/01/2019 Document Reviewed: 06/13/2017 Elsevier Patient Education  [place].

## 2020-10-01 ENCOUNTER — Other Ambulatory Visit: Payer: Self-pay | Admitting: Family Medicine

## 2020-10-01 DIAGNOSIS — D709 Neutropenia, unspecified: Secondary | ICD-10-CM

## 2020-10-01 DIAGNOSIS — D696 Thrombocytopenia, unspecified: Secondary | ICD-10-CM

## 2020-10-28 ENCOUNTER — Ambulatory Visit (HOSPITAL_COMMUNITY)
Admission: EM | Admit: 2020-10-28 | Discharge: 2020-10-28 | Disposition: A | Discharge status: Home / Self Care | Payer: Commercial Managed Care - PPO | Attending: Family Medicine | Admitting: Family Medicine

## 2020-10-28 ENCOUNTER — Encounter (HOSPITAL_COMMUNITY): Payer: Self-pay

## 2020-10-28 ENCOUNTER — Other Ambulatory Visit: Payer: Self-pay

## 2020-10-28 DIAGNOSIS — R519 Headache, unspecified: Secondary | ICD-10-CM | POA: Insufficient documentation

## 2020-10-28 DIAGNOSIS — R110 Nausea: Secondary | ICD-10-CM | POA: Insufficient documentation

## 2020-10-28 DIAGNOSIS — E119 Type 2 diabetes mellitus without complications: Secondary | ICD-10-CM | POA: Diagnosis not present

## 2020-10-28 DIAGNOSIS — Z20822 Contact with and (suspected) exposure to covid-19: Secondary | ICD-10-CM | POA: Insufficient documentation

## 2020-10-28 LAB — SARS CORONAVIRUS 2 (TAT 6-24 HRS): SARS Coronavirus 2: NEGATIVE

## 2020-10-28 MED ORDER — ONDANSETRON 4 MG PO TBDP: 4.0000 mg | ORAL_TABLET | Freq: Three times a day (TID) | ORAL | 0 refills | Status: DC | PRN

## 2020-10-28 NOTE — ED Provider Notes (Signed)
[facility]    CSN: [id number] Arrival date & time: 10/28/20  0940      History   Chief Complaint Chief Complaint  Patient presents with  . Headache  . Nausea    HPI Troy Gonzalez is a 65 y.o. male.   Here today with 4 day history of headaches, nausea, malaise, post nasal drainage, body aches. Denies CP, SOB, vomiting, diarrhea, known fever. Took some tylenol without relief. No known sick contacts, chronic medical problems aside from DM.      Past Medical History:  Diagnosis Date  . History of chicken pox     Patient Active Problem List   Diagnosis Date Noted  . Diabetes mellitus without complication (HCC) 08/20/2019    Past Surgical History:  Procedure Laterality Date  . CIRCUMCISION  09/22/2011   Procedure: CIRCUMCISION ADULT;  Surgeon: Lindaann Slough, MD;  Location: [facility];  Service: Urology;  Laterality: N/A;  . none         Home Medications    Prior to Admission medications   Medication Sig Start Date End Date Taking? Authorizing Provider  ondansetron (ZOFRAN ODT) 4 MG disintegrating tablet Take 1 tablet (4 mg total) by mouth every 8 (eight) hours as needed for nausea or vomiting. 10/28/20  Yes Particia Nearing, PA-C  naproxen (NAPROSYN) 375 MG tablet Take 1 tablet (375 mg total) by mouth 2 (two) times daily. Patient not taking: Reported on 08/14/2019 11/20/18   Rennis Harding, PA-C    Family History Family History  Problem Relation Age of Onset  . Asthma Mother   . Kidney disease Sister   . Stroke Maternal Grandmother     Social History Social History   Tobacco Use  . Smoking status: Never Smoker  . Smokeless tobacco: Never Used  Substance Use Topics  . Alcohol use: No  . Drug use: No     Allergies   Patient has no known allergies.   Review of Systems Review of Systems PER HPI    Physical Exam Triage Vital Signs ED Triage Vitals  Enc Vitals Group     BP 10/28/20 1029 134/84     Pulse  Rate 10/28/20 1029 87     Resp 10/28/20 1029 18     Temp 10/28/20 1029 99 F (37.2 C)     Temp src --      SpO2 10/28/20 1029 99 %     Weight --      Height --      Head Circumference --      Peak Flow --      Pain Score 10/28/20 1027 5     Pain Loc --      Pain Edu? --      Excl. in GC? --    No data found.  Updated Vital Signs BP 134/84 (BP Location: Left Arm)   Pulse 87   Temp 99 F (37.2 C)   Resp 18   SpO2 99%   Visual Acuity Right Eye Distance:   Left Eye Distance:   Bilateral Distance:    Right Eye Near:   Left Eye Near:    Bilateral Near:     Physical Exam Vitals and nursing note reviewed.  Constitutional:      Appearance: Normal appearance.  HENT:     Head: Atraumatic.     Right Ear: Tympanic membrane normal.     Left Ear: Tympanic membrane normal.     Nose: Nose normal.  Mouth/Throat:     Mouth: Mucous membranes are moist.     Pharynx: Oropharynx is clear. No oropharyngeal exudate.  Eyes:     Extraocular Movements: Extraocular movements intact.     Conjunctiva/sclera: Conjunctivae normal.  Cardiovascular:     Rate and Rhythm: Normal rate and regular rhythm.  Pulmonary:     Effort: Pulmonary effort is normal. No respiratory distress.     Breath sounds: Normal breath sounds. No wheezing or rales.  Abdominal:     General: Bowel sounds are normal. There is no distension.     Palpations: Abdomen is soft.     Tenderness: There is no abdominal tenderness. There is no guarding.  Musculoskeletal:        General: Normal range of motion.     Cervical back: Normal range of motion and neck supple.  Skin:    General: Skin is warm and dry.  Neurological:     General: No focal deficit present.     Mental Status: He is oriented to person, place, and time.     Cranial Nerves: No cranial nerve deficit.     Motor: No weakness.     Gait: Gait normal.  Psychiatric:        Mood and Affect: Mood normal.        Thought Content: Thought content normal.         Judgment: Judgment normal.      UC Treatments / Results  Labs (all labs ordered are listed, but only abnormal results are displayed) Labs Reviewed  SARS CORONAVIRUS 2 (TAT 6-24 HRS)    EKG   Radiology No results found.  Procedures Procedures (including critical care time)  Medications Ordered in UC Medications - No data to display  Initial Impression / Assessment and Plan / UC Course  I have reviewed the triage vital signs and the nursing notes.  Pertinent labs & imaging results that were available during my care of the patient were reviewed by me and considered in my medical decision making (see chart for details).     Exam and vitals benign, suspect COVID related illness. COVID pcr pending, zofran sent for prn nausea, OTC pain relievers and fluids for headache. Return for acutely worsening sxs.   Final Clinical Impressions(s) / UC Diagnoses   Final diagnoses:  Nausea  Bad headache   Discharge Instructions   None    ED Prescriptions    Medication Sig Dispense Auth. Provider   ondansetron (ZOFRAN ODT) 4 MG disintegrating tablet Take 1 tablet (4 mg total) by mouth every 8 (eight) hours as needed for nausea or vomiting. 20 tablet Particia Nearing, [place]     PDMP not reviewed this encounter.   Particia Nearing, [place] 10/28/20 1149

## 2020-10-28 NOTE — ED Triage Notes (Signed)
Pt in with c/o headaches and "queezy feeling" in his stomach that started Monday.  Pt took tylenol on Monday with no relief  Denies coughing, diarrhea, runny nose

## 2021-04-08 ENCOUNTER — Ambulatory Visit (HOSPITAL_COMMUNITY)
Admission: EM | Admit: 2021-04-08 | Discharge: 2021-04-08 | Disposition: A | Discharge status: Home / Self Care | Payer: BLUE CROSS/BLUE SHIELD

## 2021-04-08 ENCOUNTER — Encounter (HOSPITAL_COMMUNITY): Payer: Self-pay | Admitting: Emergency Medicine

## 2021-04-08 ENCOUNTER — Other Ambulatory Visit: Payer: Self-pay

## 2021-04-08 DIAGNOSIS — R197 Diarrhea, unspecified: Secondary | ICD-10-CM | POA: Diagnosis not present

## 2021-04-08 NOTE — ED Provider Notes (Signed)
[facility]    CSN: [id number] Arrival date & time: 04/08/21  1055      History   Chief Complaint Chief Complaint  Patient presents with   Diarrhea    HPI Troy Gonzalez is a 65 y.o. male.   HPI  Diarrhea: Patient reports that he has had diarrhea for the past 3 days.  He states that the first day of diarrhea he had probably about 10 episodes.  Over the next 2 days his symptoms have improved.  He reports that the diarrhea is watery brown in nature without any blood or mucus involvement.  He denies any significant pain, nausea, vomiting, fever, recent antibiotic use, dysuria, recent hospitalizations or surgeries or procedures.  He has not tried anything for symptoms.  Past Medical History:  Diagnosis Date   History of chicken pox     Patient Active Problem List   Diagnosis Date Noted   Diabetes mellitus without complication (HCC) 08/20/2019    Past Surgical History:  Procedure Laterality Date   CIRCUMCISION  09/22/2011   Procedure: CIRCUMCISION ADULT;  Surgeon: Lindaann Slough, MD;  Location: [facility];  Service: Urology;  Laterality: N/A;   none         Home Medications    Prior to Admission medications   Medication Sig Start Date End Date Taking? Authorizing Provider  naproxen (NAPROSYN) 375 MG tablet Take 1 tablet (375 mg total) by mouth 2 (two) times daily. Patient not taking: Reported on 08/14/2019 11/20/18   Wurst, [place], PA-C  ondansetron (ZOFRAN ODT) 4 MG disintegrating tablet Take 1 tablet (4 mg total) by mouth every 8 (eight) hours as needed for nausea or vomiting. 10/28/20   Particia Nearing, PA-C    Family History Family History  Problem Relation Age of Onset   Asthma Mother    Kidney disease Sister    Stroke Maternal Grandmother     Social History Social History   Tobacco Use   Smoking status: Never   Smokeless tobacco: Never  Substance Use Topics   Alcohol use: No   Drug use: No     Allergies    Patient has no known allergies.   Review of Systems Review of Systems  As stated above in HPI Physical Exam Triage Vital Signs ED Triage Vitals  Enc Vitals Group     BP 04/08/21 1139 (!) 149/84     Pulse Rate 04/08/21 1139 83     Resp 04/08/21 1139 14     Temp 04/08/21 1139 99.1 F (37.3 C)     Temp Source 04/08/21 1139 Oral     SpO2 04/08/21 1139 99 %     Weight --      Height --      Head Circumference --      Peak Flow --      Pain Score 04/08/21 1141 0     Pain Loc --      Pain Edu? --      Excl. in GC? --    No data found.  Updated Vital Signs BP (!) 149/84 (BP Location: Left Arm)   Pulse 83   Temp 99.1 F (37.3 C) (Oral)   Resp 14   SpO2 99%   Physical Exam Vitals and nursing note reviewed.  Constitutional:      General: He is not in acute distress.    Appearance: Normal appearance. He is obese. He is not ill-appearing, toxic-appearing or diaphoretic.  HENT:  Mouth/Throat:     Mouth: Mucous membranes are moist.  Cardiovascular:     Rate and Rhythm: Normal rate and regular rhythm.     Heart sounds: Normal heart sounds.  Pulmonary:     Effort: Pulmonary effort is normal.     Breath sounds: Normal breath sounds.  Abdominal:     General: Bowel sounds are normal. There is no distension.     Palpations: Abdomen is soft. There is no mass.     Tenderness: There is no abdominal tenderness. There is no right CVA tenderness, left CVA tenderness, guarding or rebound.     Hernia: No hernia is present.  Musculoskeletal:     Cervical back: Normal range of motion and neck supple.  Skin:    General: Skin is warm.     Coloration: Skin is not jaundiced.  Neurological:     Mental Status: He is alert and oriented to person, place, and time.     UC Treatments / Results  Labs (all labs ordered are listed, but only abnormal results are displayed) Labs Reviewed - No data to display  EKG   Radiology No results found.  Procedures Procedures (including  critical care time)  Medications Ordered in UC Medications - No data to display  Initial Impression / Assessment and Plan / UC Course  I have reviewed the triage vital signs and the nursing notes.  Pertinent labs & imaging results that were available during my care of the patient were reviewed by me and considered in my medical decision making (see chart for details).     New.  Likely viral gastroenteritis.  Treating with fluids and foods to help relieve diarrhea.  We also discussed why foods helping diarrhea is much safer than medication.  We had a long discussion about red flag signs and symptoms.  Final Clinical Impressions(s) / UC Diagnoses   Final diagnoses:  None   Discharge Instructions   None    ED Prescriptions   None    PDMP not reviewed this encounter.   Rushie Chestnut, [place] 04/08/21 1225

## 2021-04-08 NOTE — ED Triage Notes (Signed)
Diarrhea x 3 days. Denies fever, N/V, cough, sore throat, abdominal pain. Denies this having happened before. Has not tried anything to help

## 2021-06-20 ENCOUNTER — Ambulatory Visit (INDEPENDENT_AMBULATORY_CARE_PROVIDER_SITE_OTHER): Payer: BLUE CROSS/BLUE SHIELD

## 2021-06-20 ENCOUNTER — Other Ambulatory Visit: Payer: Self-pay

## 2021-06-20 ENCOUNTER — Ambulatory Visit (HOSPITAL_COMMUNITY)
Admission: EM | Admit: 2021-06-20 | Discharge: 2021-06-20 | Disposition: A | Discharge status: Home / Self Care | Payer: BLUE CROSS/BLUE SHIELD | Attending: Emergency Medicine | Admitting: Emergency Medicine

## 2021-06-20 ENCOUNTER — Encounter (HOSPITAL_COMMUNITY): Payer: Self-pay

## 2021-06-20 DIAGNOSIS — M25562 Pain in left knee: Secondary | ICD-10-CM | POA: Diagnosis not present

## 2021-06-20 MED ORDER — MELOXICAM 7.5 MG PO TABS: 7.5000 mg | ORAL_TABLET | Freq: Every day | ORAL | 0 refills | Status: DC

## 2021-06-20 NOTE — ED Triage Notes (Signed)
Pt c/o lt knee pain with swelling x1wk. Denies injury. States wore a knee brace today with relief.

## 2021-06-20 NOTE — ED Provider Notes (Signed)
[facility]    CSN: [id number] Arrival date & time: 06/20/21  1344      History   Chief Complaint Chief Complaint  Patient presents with   Knee Pain    HPI Troy Gonzalez is a 65 y.o. male.   Patient here for evaluation of left knee pain and swelling that has been ongoing for the past week.  Reports pain started after being on his knees at work a lot.  Reports pain worse when bearing weight or when walking up stairs.  Reports using icy hot with minimal symptom relief.  Reports wearing a knee brace this morning with some symptom relief.  Denies any trauma, injury, or other precipitating event.  Denies any specific alleviating or aggravating factors.  Denies any fevers, chest pain, shortness of breath, N/V/D, numbness, tingling, weakness, abdominal pain, or headaches.    The history is provided by the patient.  Knee Pain  Past Medical History:  Diagnosis Date   History of chicken pox     Patient Active Problem List   Diagnosis Date Noted   Diabetes mellitus without complication (HCC) 08/20/2019    Past Surgical History:  Procedure Laterality Date   CIRCUMCISION  09/22/2011   Procedure: CIRCUMCISION ADULT;  Surgeon: Lindaann Slough, MD;  Location: [facility];  Service: Urology;  Laterality: N/A;   none         Home Medications    Prior to Admission medications   Medication Sig Start Date End Date Taking? Authorizing Provider  meloxicam (MOBIC) 7.5 MG tablet Take 1 tablet (7.5 mg total) by mouth daily. 06/20/21  Yes Ivette Loyal, NP  ondansetron (ZOFRAN ODT) 4 MG disintegrating tablet Take 1 tablet (4 mg total) by mouth every 8 (eight) hours as needed for nausea or vomiting. 10/28/20   Particia Nearing, PA-C    Family History Family History  Problem Relation Age of Onset   Asthma Mother    Kidney disease Sister    Stroke Maternal Grandmother     Social History Social History   Tobacco Use   Smoking status: Never    Smokeless tobacco: Never  Substance Use Topics   Alcohol use: No   Drug use: No     Allergies   Patient has no known allergies.   Review of Systems Review of Systems  Musculoskeletal:  Positive for arthralgias and joint swelling.  All other systems reviewed and are negative.   Physical Exam Triage Vital Signs ED Triage Vitals  Enc Vitals Group     BP 06/20/21 1524 (!) 153/87     Pulse Rate 06/20/21 1524 72     Resp 06/20/21 1524 18     Temp 06/20/21 1524 98.8 F (37.1 C)     Temp Source 06/20/21 1524 Oral     SpO2 06/20/21 1524 97 %     Weight --      Height --      Head Circumference --      Peak Flow --      Pain Score 06/20/21 1525 6     Pain Loc --      Pain Edu? --      Excl. in GC? --    No data found.  Updated Vital Signs BP (!) 153/87 (BP Location: Left Arm)   Pulse 72   Temp 98.8 F (37.1 C) (Oral)   Resp 18   SpO2 97%   Visual Acuity Right Eye Distance:   Left Eye  Distance:   Bilateral Distance:    Right Eye Near:   Left Eye Near:    Bilateral Near:     Physical Exam Vitals and nursing note reviewed.  Constitutional:      General: He is not in acute distress.    Appearance: Normal appearance. He is not ill-appearing, toxic-appearing or diaphoretic.  HENT:     Head: Normocephalic and atraumatic.  Eyes:     Conjunctiva/sclera: Conjunctivae normal.  Cardiovascular:     Rate and Rhythm: Normal rate.     Pulses: Normal pulses.  Pulmonary:     Effort: Pulmonary effort is normal.  Abdominal:     General: Abdomen is flat.  Musculoskeletal:     Cervical back: Normal range of motion.     Right knee: Normal.     Left knee: Swelling and bony tenderness present. No deformity, effusion, erythema or crepitus. Normal range of motion. Tenderness present. No LCL laxity, MCL laxity, ACL laxity or PCL laxity.Normal alignment, normal meniscus and normal patellar mobility. Normal pulse.     Instability Tests: Anterior drawer test negative. Posterior  drawer test negative. Anterior Lachman test negative. Medial McMurray test negative and lateral McMurray test negative.  Skin:    General: Skin is warm and dry.  Neurological:     General: No focal deficit present.     Mental Status: He is alert and oriented to person, place, and time.  Psychiatric:        Mood and Affect: Mood normal.     UC Treatments / Results  Labs (all labs ordered are listed, but only abnormal results are displayed) Labs Reviewed - No data to display  EKG   Radiology DG Knee AP/LAT W/Sunrise Left  Result Date: 06/20/2021 CLINICAL DATA:  Left knee pain, swelling and decreased range of motion for 1 week EXAM: LEFT KNEE 3 VIEWS COMPARISON:  None. FINDINGS: Small suprapatellar left knee joint effusion. No fracture, dislocation or suspicious focal osseous lesions. Small superior left patellar enthesophyte. 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 No radiopaque foreign bodies. IMPRESSION: Small suprapatellar left knee joint effusion. 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 Electronically Signed   By: Delbert Phenix M.D.   On: 06/20/2021 16:10    Procedures Procedures (including critical care time)  Medications Ordered in UC Medications - No data to display  Initial Impression / Assessment and Plan / UC Course  I have reviewed the triage vital signs and the nursing notes.  Pertinent labs & imaging results that were available during my care of the patient were reviewed by me and considered in my medical decision making (see chart for details).    Assessment negative for red flags or concerns.  X-ray with small suprapatellar joint effusion and mild to moderate tricompartmental osteoarthritis.  Knee pain likely related to arthritis.  We will treat with meloxicam daily.  May also take Tylenol as needed.   Discussed conservative symptom management including heat, ice, and Voltaren gel.  Follow-up with primary care for reevaluation. Final Clinical Impressions(s) / UC Diagnoses   Final diagnoses:  Acute pain of left knee     Discharge Instructions      Take the meloxicam daily to help with pain and swelling.  You may also take Tylenol as needed for pain. You can use heat, ice, or alternate between heat and ice for comfort.  You can use IcyHot, lidocaine patches, Biofreeze, Aspercreme, or Voltaren  gel as needed for pain relief.  Follow-up with your primary care provider for reevaluation.     ED Prescriptions     Medication Sig Dispense Auth. Provider   meloxicam (MOBIC) 7.5 MG tablet Take 1 tablet (7.5 mg total) by mouth daily. 30 tablet Ivette Loyal, NP      PDMP not reviewed this encounter.   Ivette Loyal, NP 06/20/21 1640

## 2021-06-20 NOTE — Discharge Instructions (Signed)
Take the meloxicam daily to help with pain and swelling.  You may also take Tylenol as needed for pain. You can use heat, ice, or alternate between heat and ice for comfort.  You can use IcyHot, lidocaine patches, Biofreeze, Aspercreme, or Voltaren gel as needed for pain relief.  Follow-up with your primary care provider for reevaluation.

## 2021-10-06 ENCOUNTER — Ambulatory Visit (INDEPENDENT_AMBULATORY_CARE_PROVIDER_SITE_OTHER): Payer: BLUE CROSS/BLUE SHIELD | Admitting: Family Medicine

## 2021-10-06 VITALS — BP 140/86 | HR 80 | Temp 98.9°F | Ht 74.5 in | Wt 219.6 lb

## 2021-10-06 DIAGNOSIS — E119 Type 2 diabetes mellitus without complications: Secondary | ICD-10-CM | POA: Diagnosis not present

## 2021-10-06 DIAGNOSIS — Z125 Encounter for screening for malignant neoplasm of prostate: Secondary | ICD-10-CM

## 2021-10-06 DIAGNOSIS — Z0000 Encounter for general adult medical examination without abnormal findings: Secondary | ICD-10-CM | POA: Diagnosis not present

## 2021-10-06 DIAGNOSIS — Z1211 Encounter for screening for malignant neoplasm of colon: Secondary | ICD-10-CM

## 2021-10-06 DIAGNOSIS — I459 Conduction disorder, unspecified: Secondary | ICD-10-CM | POA: Diagnosis not present

## 2021-10-06 DIAGNOSIS — R030 Elevated blood-pressure reading, without diagnosis of hypertension: Secondary | ICD-10-CM

## 2021-10-06 NOTE — Progress Notes (Signed)
Subjective:     Troy Gonzalez is a 66 y.o. male and is here for a comprehensive physical exam. The patient reports no problems.  Patient states he is doing well.  Staying physically active and getting over 10,000 steps per day at work alone.  Plans to schedule colonoscopy.  Patient endorses having influenza vaccine and COVID vaccines at local pharmacy.  Unsure of the dates, but will check.  Social History   Socioeconomic History   Marital status: Married    Spouse name: Not on file   Number of children: Not on file   Years of education: Not on file   Highest education level: Not on file  Occupational History   Not on file  Tobacco Use   Smoking status: Never   Smokeless tobacco: Never  Substance and Sexual Activity   Alcohol use: No   Drug use: No   Sexual activity: Not on file  Other Topics Concern   Not on file  Social History Narrative   Not on file   Social Determinants of Health   Financial Resource Strain: Not on file  Food Insecurity: Not on file  Transportation Needs: Not on file  Physical Activity: Not on file  Stress: Not on file  Social Connections: Not on file  Intimate Partner Violence: Not on file   Health Maintenance  Topic Date Due   Pneumonia Vaccine 61+ Years old (1 - PCV) Never done   FOOT EXAM  Never done   OPHTHALMOLOGY EXAM  Never done   URINE MICROALBUMIN  Never done   COLONOSCOPY (Pts 45-63yrs Insurance coverage will need to be confirmed)  Never done   Zoster Vaccines- Shingrix (1 of 2) Never done   COVID-19 Vaccine (4 - Booster for Pfizer series) 09/23/2020   HEMOGLOBIN A1C  03/24/2021   INFLUENZA VACCINE  04/25/2021   TETANUS/TDAP  05/24/2025   Hepatitis C Screening  Completed   HPV VACCINES  Aged Out    The following portions of the patient's history were reviewed and updated as appropriate: allergies, current medications, past family history, past medical history, past social history, past surgical history, and problem  list.  Review of Systems Pertinent items noted in HPI and remainder of comprehensive ROS otherwise negative.   Objective:    BP 140/86 (BP Location: Right Arm, Patient Position: Sitting, Cuff Size: Large)    Pulse 80    Temp 98.9 F (37.2 C) (Oral)    Ht 6' 2.5" (1.892 m)    Wt 219 lb 9.6 oz (99.6 kg)    SpO2 97%    BMI 27.82 kg/m  General appearance: alert, cooperative, and no distress Head: Normocephalic, without obvious abnormality, atraumatic Eyes: conjunctivae/corneas clear. PERRL, EOM's intact. Fundi benign. Ears: normal TM's and external ear canals both ears Nose: Nares normal. Septum midline. Mucosa normal. No drainage or sinus tenderness. Throat: lips, mucosa, and tongue normal; teeth and gums normal Neck: no adenopathy, no carotid bruit, no JVD, supple, symmetrical, trachea midline, and thyroid not enlarged, symmetric, no tenderness/mass/nodules Lungs: clear to auscultation bilaterally Heart: irregular rhythm/skipped beats, S1, S2 normal, no murmur, click, rub or gallop Abdomen: soft, non-tender; bowel sounds normal; no masses,  no organomegaly Extremities: extremities normal, atraumatic, no cyanosis or edema Pulses: 2+ and symmetric Skin: Skin color, texture, turgor normal. No rashes or lesions Lymph nodes: Cervical, supraclavicular, and axillary nodes normal. Neurologic: Alert and oriented X 3, normal strength and tone. Normal symmetric reflexes. Normal coordination and gait    Assessment:  Healthy male exam with several skipped heart beats on exam    Plan:    Anticipatory guidance given including wearing seatbelts, smoke detectors in the home, increasing physical activity, increasing p.o. intake of water and vegetables. -labs -discussed immunizations.  Patient to consider Pneumovax, shingles.  Had influenza vaccine and COVID vaccines, will check on exact dates. -We will place referral for colonoscopy -Given handout -Next CPE in 1 year See After Visit Summary for  Counseling Recommendations   Skipped heart beats -Asymptomatic.  Noted on exam -EKG NSR.  RR' in V1 and V2 without changes in QRS complex and V6.  Consider early RBBB.  Similar findings noted on previous EKGs from 2015. - Plan: EKG 12-Lead, CBC with Differential/Platelet, Basic metabolic panel, TSH, T4, Free, Lipid panel  Colon cancer screening  - Plan: Ambulatory referral to Gastroenterology  Diabetes mellitus without complication (HCC)  -Hemoglobin A1c 6.7% on 09/23/2020 -Diet controlled -Foot exam done this visit -Continue lifestyle modifications -Pt to schedule eye exam - Plan: Microalbumin/Creatinine Ratio, Urine, Hemoglobin A1c, Lipid panel  Elevated blood pressure reading without diagnosis of hypertension  -Lifestyle modifications -Consider obtaining BP cuff for home measurements  -follow-up in 1-2 months for BP recheck - Plan: Basic metabolic panel  Prostate cancer screening  - Plan: PSA  F/u in 1-2 months for BP check  Abbe Amsterdam, MD

## 2021-10-07 LAB — CBC WITH DIFFERENTIAL/PLATELET
Basophils Absolute: 0.1 10*3/uL (ref 0.0–0.1)
Basophils Relative: 1.6 % (ref 0.0–3.0)
Eosinophils Absolute: 0.1 10*3/uL (ref 0.0–0.7)
Eosinophils Relative: 2.1 % (ref 0.0–5.0)
HCT: 39.5 % (ref 39.0–52.0)
Hemoglobin: 12.8 g/dL — ABNORMAL LOW (ref 13.0–17.0)
Lymphocytes Relative: 36.5 % (ref 12.0–46.0)
Lymphs Abs: 1.6 10*3/uL (ref 0.7–4.0)
MCHC: 32.5 g/dL (ref 30.0–36.0)
MCV: 92.7 fl (ref 78.0–100.0)
Monocytes Absolute: 0.5 10*3/uL (ref 0.1–1.0)
Monocytes Relative: 10.7 % (ref 3.0–12.0)
Neutro Abs: 2.2 10*3/uL (ref 1.4–7.7)
Neutrophils Relative %: 49.1 % (ref 43.0–77.0)
Platelets: 143 10*3/uL — ABNORMAL LOW (ref 150.0–400.0)
RBC: 4.27 Mil/uL (ref 4.22–5.81)
RDW: 14.5 % (ref 11.5–15.5)
WBC: 4.4 10*3/uL (ref 4.0–10.5)

## 2021-10-07 LAB — BASIC METABOLIC PANEL
BUN: 26 mg/dL — ABNORMAL HIGH (ref 6–23)
CO2: 23 mEq/L (ref 19–32)
Calcium: 9.1 mg/dL (ref 8.4–10.5)
Chloride: 106 mEq/L (ref 96–112)
Creatinine, Ser: 1.26 mg/dL (ref 0.40–1.50)
GFR: 59.47 mL/min — ABNORMAL LOW (ref >60.00)
Glucose, Bld: 97 mg/dL (ref 70–99)
Potassium: 4.1 mEq/L (ref 3.5–5.1)
Sodium: 140 mEq/L (ref 135–145)

## 2021-10-07 LAB — LIPID PANEL
Cholesterol: 205 mg/dL — ABNORMAL HIGH (ref 0–200)
HDL: 48.6 mg/dL (ref >39.00)
LDL Cholesterol: 137 mg/dL — ABNORMAL HIGH (ref 0–99)
NonHDL: 155.93
Total CHOL/HDL Ratio: 4
Triglycerides: 93 mg/dL (ref 0.0–149.0)
VLDL: 18.6 mg/dL (ref 0.0–40.0)

## 2021-10-07 LAB — T4, FREE: Free T4: 0.7 ng/dL (ref 0.60–1.60)

## 2021-10-07 LAB — MICROALBUMIN / CREATININE URINE RATIO
Creatinine,U: 145.5 mg/dL
Microalb Creat Ratio: 0.5 mg/g (ref 0.0–30.0)
Microalb, Ur: <0.7 mg/dL (ref 0.0–1.9)

## 2021-10-07 LAB — TSH: TSH: 2 u[IU]/mL (ref 0.35–5.50)

## 2021-10-07 LAB — PSA: PSA: 0.65 ng/mL (ref 0.10–4.00)

## 2021-10-07 LAB — HEMOGLOBIN A1C: Hgb A1c MFr Bld: 7 % — ABNORMAL HIGH (ref 4.6–6.5)

## 2022-03-11 ENCOUNTER — Encounter (HOSPITAL_COMMUNITY): Payer: Self-pay | Admitting: Physician Assistant

## 2022-03-11 ENCOUNTER — Ambulatory Visit (HOSPITAL_COMMUNITY)
Admission: EM | Admit: 2022-03-11 | Discharge: 2022-03-11 | Disposition: A | Discharge status: Home / Self Care | Payer: PRIVATE HEALTH INSURANCE | Attending: Physician Assistant | Admitting: Physician Assistant

## 2022-03-11 DIAGNOSIS — T63481A Toxic effect of venom of other arthropod, accidental (unintentional), initial encounter: Secondary | ICD-10-CM | POA: Diagnosis not present

## 2022-03-11 MED ORDER — CETIRIZINE HCL 10 MG PO TABS: 10.0000 mg | ORAL_TABLET | Freq: Every day | ORAL | 0 refills | Status: DC

## 2022-03-11 MED ORDER — METHYLPREDNISOLONE SODIUM SUCC 125 MG IJ SOLR
INTRAMUSCULAR | Status: AC
  Filled 2022-03-11: qty 2

## 2022-03-11 MED ORDER — DIPHENHYDRAMINE HCL 25 MG PO CAPS: 25.0000 mg | ORAL_CAPSULE | Freq: Four times a day (QID) | ORAL | 0 refills | Status: DC | PRN

## 2022-03-11 MED ORDER — METHYLPREDNISOLONE SODIUM SUCC 125 MG IJ SOLR
80.0000 mg | Freq: Once | INTRAMUSCULAR | Status: AC
  Administered 2022-03-11: 80 mg via INTRAMUSCULAR

## 2022-03-11 MED ORDER — STERILE WATER FOR INJECTION IJ SOLN
INTRAMUSCULAR | Status: AC
  Filled 2022-03-11: qty 10

## 2022-03-11 NOTE — ED Provider Notes (Signed)
[facility]    CSN: [id number] Arrival date & time: 03/11/22  1005      History   Chief Complaint Chief Complaint  Patient presents with   Insect Bite    HPI Troy Gonzalez is a 66 y.o. male.   HPI Patient is a pleasant 66 year old male who presents by personal vehicle to the urgent care today (his nephew drove him here) for complaints of multiple insect stings.  Patient states that the stings occurred late last evening, it was too dark to see what actually stung him.  He says it stung him on the right forearm, around the right eye, the left eye, the left side of his chest.  He did not take anything last night for the stings.  He woke up this morning and the skin around his left eye was swollen.  He denies any chest pain, shortness of breath, difficulty breathing, wheezing, trouble swallowing, lip swelling, rash, or other acute concerns going on today.  He has a history of diabetes and his last A1c was 7.0 about 5 months ago.  Patient states that he does not take any medications for his diabetes at this time. No known allergy to bees or insects. Does not have an epi-pen.    Past Medical History:  Diagnosis Date   History of chicken pox     Patient Active Problem List   Diagnosis Date Noted   Diabetes mellitus without complication (HCC) 08/20/2019    Past Surgical History:  Procedure Laterality Date   CIRCUMCISION  09/22/2011   Procedure: CIRCUMCISION ADULT;  Surgeon: Lindaann Slough, MD;  Location: [facility];  Service: Urology;  Laterality: N/A;   none         Home Medications    Prior to Admission medications   Medication Sig Start Date End Date Taking? Authorizing Provider  cetirizine (ZYRTEC) 10 MG tablet Take 1 tablet (10 mg total) by mouth daily. 03/11/22 04/10/22 Yes Tauna Macfarlane, Crist Infante, PA-C  diphenhydrAMINE (BENADRYL) 25 mg capsule Take 1 capsule (25 mg total) by mouth every 6 (six) hours as needed. 03/11/22  Yes Haig Gerardo, Crist Infante,  PA-C    Family History Family History  Problem Relation Age of Onset   Asthma Mother    Kidney disease Sister    Stroke Maternal Grandmother     Social History Social History   Tobacco Use   Smoking status: Never   Smokeless tobacco: Never  Substance Use Topics   Alcohol use: No   Drug use: No     Allergies   Patient has no known allergies.   Review of Systems Review of Systems SEE HPI   Physical Exam Triage Vital Signs ED Triage Vitals [03/11/22 1022]  Enc Vitals Group     BP (!) 147/84     Pulse Rate 94     Resp 18     Temp      Temp Source Oral     SpO2 100 %     Weight      Height      Head Circumference      Peak Flow      Pain Score      Pain Loc      Pain Edu?      Excl. in GC?    No data found.  Updated Vital Signs BP (!) 147/84 (BP Location: Left Arm)   Pulse 94   Temp 98.3 F (36.8 C) (Oral)   Resp 18  SpO2 100%      Physical Exam Vitals and nursing note reviewed.  Constitutional:      General: He is not in acute distress.    Appearance: Normal appearance. He is not toxic-appearing.  HENT:     Head: Normocephalic and atraumatic.     Right Ear: Tympanic membrane normal.     Left Ear: Tympanic membrane normal.     Mouth/Throat:     Mouth: Mucous membranes are moist.     Pharynx: Oropharynx is clear.  Eyes:     General:        Right eye: No discharge.        Left eye: No discharge.     Extraocular Movements: Extraocular movements intact.     Right eye: Normal extraocular motion.     Left eye: Normal extraocular motion.     Conjunctiva/sclera: Conjunctivae normal.     Pupils: Pupils are equal, round, and reactive to light.     Comments: L periorbital edema; no visual deficits   Cardiovascular:     Rate and Rhythm: Normal rate and regular rhythm.     Pulses: Normal pulses.     Heart sounds: Normal heart sounds.  Pulmonary:     Effort: Pulmonary effort is normal. No respiratory distress.     Breath sounds: Normal breath  sounds. No wheezing.  Musculoskeletal:     Cervical back: Normal range of motion and neck supple.  Skin:    General: Skin is warm and dry.     Comments: Small urticarial rash right ant forearm, left lateral chest wall  Neurological:     General: No focal deficit present.     Mental Status: He is alert and oriented to person, place, and time.  Psychiatric:        Mood and Affect: Mood normal.        Behavior: Behavior normal.      UC Treatments / Results    Medications Ordered in UC Medications  methylPREDNISolone sodium succinate (SOLU-MEDROL) 125 mg/2 mL injection 80 mg (has no administration in time range)    Initial Impression / Assessment and Plan / UC Course  I have reviewed the triage vital signs and the nursing notes.  Pertinent labs & imaging results that were available during my care of the patient were reviewed by me and considered in my medical decision making (see chart for details).    Insect stings, accidental or unintentional, initial encounter -No red flags noted on exam, no signs of anaphylaxis -Multiple locations of insect stings on R forearm, R side of face, Left periorbital area, left chest wall -Solu-medrol 80 mg IM today -Cautioned about possible SE including elevated glucose readings -Benadryl and cetirizine as directed -Strict ER precautions advised  Final Clinical Impressions(s) / UC Diagnoses   Final diagnoses:  Insect stings, accidental or unintentional, initial encounter     Discharge Instructions      Solu-Medrol injection 80 mg given in office today.  Please be advised this may raise your blood sugar for the next few days.  Contact your PCP if you have any readings in the high 300s or over 400.  You may use Benadryl by mouth as needed to help with any itching. Daily cetirizine 10 mg p.o. may help as well. Continue to keep cool and push fluids.  Present emergently to the ED if any sudden signs of difficulty breathing, shortness of  breath, wheezing, chest pain, throat closing feeling.      ED  Prescriptions     Medication Sig Dispense Auth. Provider   cetirizine (ZYRTEC) 10 MG tablet Take 1 tablet (10 mg total) by mouth daily. 30 tablet Amil Bouwman M, PA-C   diphenhydrAMINE (BENADRYL) 25 mg capsule Take 1 capsule (25 mg total) by mouth every 6 (six) hours as needed. 30 capsule Heddy Vidana, Crist Infante, PA-C      PDMP not reviewed this encounter.   Abhimanyu Cruces, Crist Infante, PA-C 03/11/22 1100

## 2022-03-11 NOTE — ED Triage Notes (Signed)
Pt assessed air way intact ,no resp .distress . Pt A/O speaking in full sentences. Pt reports he was stung by bee last night. Swelling to Rt eye lids noted.

## 2022-03-11 NOTE — Discharge Instructions (Addendum)
Solu-Medrol injection 80 mg given in office today.  Please be advised this may raise your blood sugar for the next few days.  Contact your PCP if you have any readings in the high 300s or over 400.  You may use Benadryl by mouth as needed to help with any itching. Daily cetirizine 10 mg p.o. may help as well. Continue to keep cool and push fluids.  Present emergently to the ED if any sudden signs of difficulty breathing, shortness of breath, wheezing, chest pain, throat closing feeling.

## 2022-10-09 ENCOUNTER — Encounter: Payer: Self-pay | Admitting: Family Medicine

## 2022-10-09 ENCOUNTER — Ambulatory Visit (INDEPENDENT_AMBULATORY_CARE_PROVIDER_SITE_OTHER): Payer: Commercial Managed Care - PPO | Admitting: Family Medicine

## 2022-10-09 VITALS — BP 122/84 | HR 65 | Temp 98.5°F | Ht 73.0 in | Wt 217.2 lb

## 2022-10-09 DIAGNOSIS — E782 Mixed hyperlipidemia: Secondary | ICD-10-CM

## 2022-10-09 DIAGNOSIS — Z125 Encounter for screening for malignant neoplasm of prostate: Secondary | ICD-10-CM | POA: Diagnosis not present

## 2022-10-09 DIAGNOSIS — Z0000 Encounter for general adult medical examination without abnormal findings: Secondary | ICD-10-CM | POA: Diagnosis not present

## 2022-10-09 DIAGNOSIS — E119 Type 2 diabetes mellitus without complications: Secondary | ICD-10-CM

## 2022-10-09 DIAGNOSIS — Z1211 Encounter for screening for malignant neoplasm of colon: Secondary | ICD-10-CM

## 2022-10-09 LAB — COMPREHENSIVE METABOLIC PANEL
ALT: 26 U/L (ref 0–53)
AST: 26 U/L (ref 0–37)
Albumin: 4.3 g/dL (ref 3.5–5.2)
Alkaline Phosphatase: 60 U/L (ref 39–117)
BUN: 15 mg/dL (ref 6–23)
CO2: 26 mEq/L (ref 19–32)
Calcium: 9.1 mg/dL (ref 8.4–10.5)
Chloride: 103 mEq/L (ref 96–112)
Creatinine, Ser: 1.13 mg/dL (ref 0.40–1.50)
GFR: 67.3 mL/min (ref >60.00)
Glucose, Bld: 97 mg/dL (ref 70–99)
Potassium: 4.1 mEq/L (ref 3.5–5.1)
Sodium: 139 mEq/L (ref 135–145)
Total Bilirubin: 0.4 mg/dL (ref 0.2–1.2)
Total Protein: 7.9 g/dL (ref 6.0–8.3)

## 2022-10-09 LAB — CBC WITH DIFFERENTIAL/PLATELET
Basophils Absolute: 0 10*3/uL (ref 0.0–0.1)
Basophils Relative: 0.4 % (ref 0.0–3.0)
Eosinophils Absolute: 0.1 10*3/uL (ref 0.0–0.7)
Eosinophils Relative: 3.1 % (ref 0.0–5.0)
HCT: 39.2 % (ref 39.0–52.0)
Hemoglobin: 12.8 g/dL — ABNORMAL LOW (ref 13.0–17.0)
Lymphocytes Relative: 43.8 % (ref 12.0–46.0)
Lymphs Abs: 1.9 10*3/uL (ref 0.7–4.0)
MCHC: 32.7 g/dL (ref 30.0–36.0)
MCV: 92.3 fl (ref 78.0–100.0)
Monocytes Absolute: 0.4 10*3/uL (ref 0.1–1.0)
Monocytes Relative: 9.9 % (ref 3.0–12.0)
Neutro Abs: 1.9 10*3/uL (ref 1.4–7.7)
Neutrophils Relative %: 42.8 % — ABNORMAL LOW (ref 43.0–77.0)
Platelets: 156 10*3/uL (ref 150.0–400.0)
RBC: 4.24 Mil/uL (ref 4.22–5.81)
RDW: 14.5 % (ref 11.5–15.5)
WBC: 4.4 10*3/uL (ref 4.0–10.5)

## 2022-10-09 LAB — T4, FREE: Free T4: 0.79 ng/dL (ref 0.60–1.60)

## 2022-10-09 LAB — LIPID PANEL
Cholesterol: 182 mg/dL (ref 0–200)
HDL: 46.6 mg/dL (ref >39.00)
LDL Cholesterol: 124 mg/dL — ABNORMAL HIGH (ref 0–99)
NonHDL: 135.6
Total CHOL/HDL Ratio: 4
Triglycerides: 56 mg/dL (ref 0.0–149.0)
VLDL: 11.2 mg/dL (ref 0.0–40.0)

## 2022-10-09 LAB — MICROALBUMIN / CREATININE URINE RATIO
Creatinine,U: 44.1 mg/dL
Microalb Creat Ratio: 1.6 mg/g (ref 0.0–30.0)
Microalb, Ur: <0.7 mg/dL (ref 0.0–1.9)

## 2022-10-09 LAB — PSA: PSA: 1.18 ng/mL (ref 0.10–4.00)

## 2022-10-09 LAB — HEMOGLOBIN A1C: Hgb A1c MFr Bld: 7.1 % — ABNORMAL HIGH (ref 4.6–6.5)

## 2022-10-09 LAB — TSH: TSH: 2.22 u[IU]/mL (ref 0.35–5.50)

## 2022-10-09 NOTE — Progress Notes (Signed)
Subjective:     Troy Gonzalez is a 67 y.o. male and is here for a comprehensive physical exam. The patient reports doing well.  Pt had an eye exam 2-3 wks ago.  Monitoring pressure in eye for glaucoma.  Not currently on any meds.  Inquires about getting a colonoscopy.  Social History   Socioeconomic History   Marital status: Married    Spouse name: Not on file   Number of children: Not on file   Years of education: Not on file   Highest education level: Not on file  Occupational History   Not on file  Tobacco Use   Smoking status: Never   Smokeless tobacco: Never  Substance and Sexual Activity   Alcohol use: No   Drug use: No   Sexual activity: Not on file  Other Topics Concern   Not on file  Social History Narrative   Not on file   Social Determinants of Health   Financial Resource Strain: Not on file  Food Insecurity: Not on file  Transportation Needs: Not on file  Physical Activity: Not on file  Stress: Not on file  Social Connections: Not on file  Intimate Partner Violence: Not on file   Health Maintenance  Topic Date Due   OPHTHALMOLOGY EXAM  Never done   COLONOSCOPY (Pts 45-56yrs Insurance coverage will need to be confirmed)  Never done   Zoster Vaccines- Shingrix (1 of 2) Never done   Pneumonia Vaccine 29+ Years old (1 - PCV) Never done   HEMOGLOBIN A1C  04/05/2022   INFLUENZA VACCINE  04/25/2022   COVID-19 Vaccine (4 - 2023-24 season) 05/26/2022   Diabetic kidney evaluation - eGFR measurement  10/06/2022   Diabetic kidney evaluation - Urine ACR  10/06/2022   FOOT EXAM  10/06/2022   DTaP/Tdap/Td (3 - Td or Tdap) 05/24/2025   Hepatitis C Screening  Completed   HPV VACCINES  Aged Out    The following portions of the patient's history were reviewed and updated as appropriate: allergies, current medications, past family history, past medical history, past social history, past surgical history, and problem list.  Review of Systems Pertinent items noted in  HPI and remainder of comprehensive ROS otherwise negative.   Objective:    BP 122/84 (BP Location: Right Arm, Patient Position: Sitting, Cuff Size: Large)   Pulse 65   Temp 98.5 F (36.9 C) (Oral)   Ht 6\' 1"  (1.854 m)   Wt 217 lb 3.2 oz (98.5 kg)   SpO2 98%   BMI 28.66 kg/m  General appearance: alert, cooperative, and no distress Head: Normocephalic, without obvious abnormality, atraumatic Eyes: conjunctivae/corneas clear. PERRL, EOM's intact. Fundi benign. Ears: normal TM's and external ear canals both ears Nose: Nares normal. Septum midline. Mucosa normal. No drainage or sinus tenderness. Throat: lips, mucosa, and tongue normal; teeth and gums normal Neck: no adenopathy, no carotid bruit, no JVD, supple, symmetrical, trachea midline, and thyroid not enlarged, symmetric, no tenderness/mass/nodules Lungs: clear to auscultation bilaterally Heart: regular rate and rhythm, S1, S2 normal, no murmur, click, rub or gallop Abdomen: soft, non-tender; bowel sounds normal; no masses,  no organomegaly Extremities: extremities normal, atraumatic, no cyanosis or edema Pulses: 2+ and symmetric Skin: Skin color, texture, turgor normal. No rashes or lesions Lymph nodes: Cervical, supraclavicular, and axillary nodes normal. Neurologic: Alert and oriented X 3, normal strength and tone. Normal symmetric reflexes. Normal coordination and gait    Diabetic Foot Exam - Simple   Simple Foot Form Diabetic  Foot exam was performed with the following findings: Yes 10/09/2022 11:33 AM  Visual Inspection No deformities, no ulcerations, no other skin breakdown bilaterally: Yes Sensation Testing Intact to touch and monofilament testing bilaterally: Yes Pulse Check Posterior Tibialis and Dorsalis pulse intact bilaterally: Yes Comments     Assessment:    Healthy male exam.      Plan:    Anticipatory guidance given including wearing seatbelts, smoke detectors in the home, increasing physical activity,  increasing p.o. intake of water and vegetables. -labs -immunizations reviewed -colonoscopy ordered See After Visit Summary for Counseling Recommendations   Well adult exam - Plan: CBC with Differential/Platelet  Mixed hyperlipidemia  - Plan: CBC with Differential/Platelet, TSH, T4, Free, Lipid panel, CMP  Diabetes mellitus without complication (HCC) -diet controlled -hgb A1C 7.0% on 10/06/21 -start medication if hgb A1C elevated -lifestyle modifications -eye exam done a few wks ago  - Plan: TSH, T4, Free, Hemoglobin A1c, Lipid panel, CMP, Microalbumin/Creatinine Ratio, Urine  Prostate cancer screening  - Plan: PSA  Colon cancer screening  - Plan: Ambulatory referral to Gastroenterology  F/u in 3 months   Grier Mitts, MD

## 2022-10-09 NOTE — Patient Instructions (Addendum)
Please review Colonoscopy.  Gastroenterology office will call you tomorrow to schedule this appointment.  At you next eye exam make sure they screen for diabetic retinopathy.

## 2023-01-30 IMAGING — DX DG KNEE AP/LAT W/ SUNRISE*L*
3 series · 3 of 3 positions shown · non-contrast
Comparison: None.

CLINICAL DATA: Left knee pain, swelling and decreased range of
motion for 1 week

EXAM:
LEFT KNEE 3 VIEWS

[knee ap]
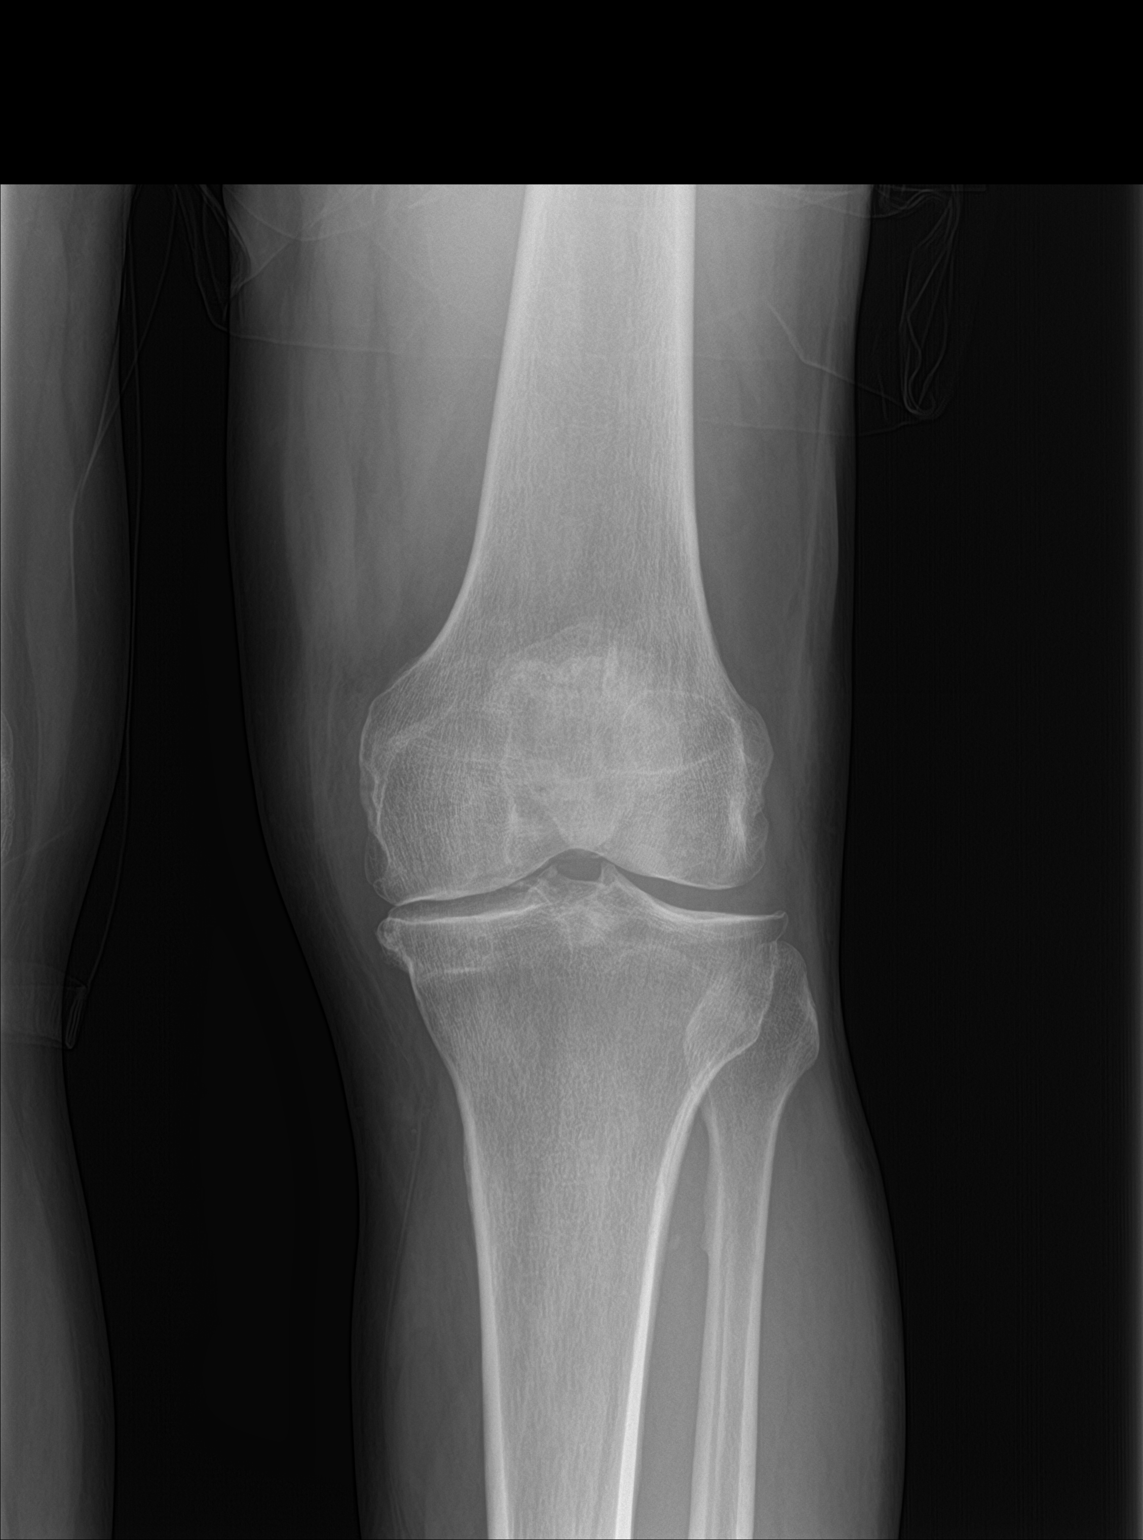

[knee lat]
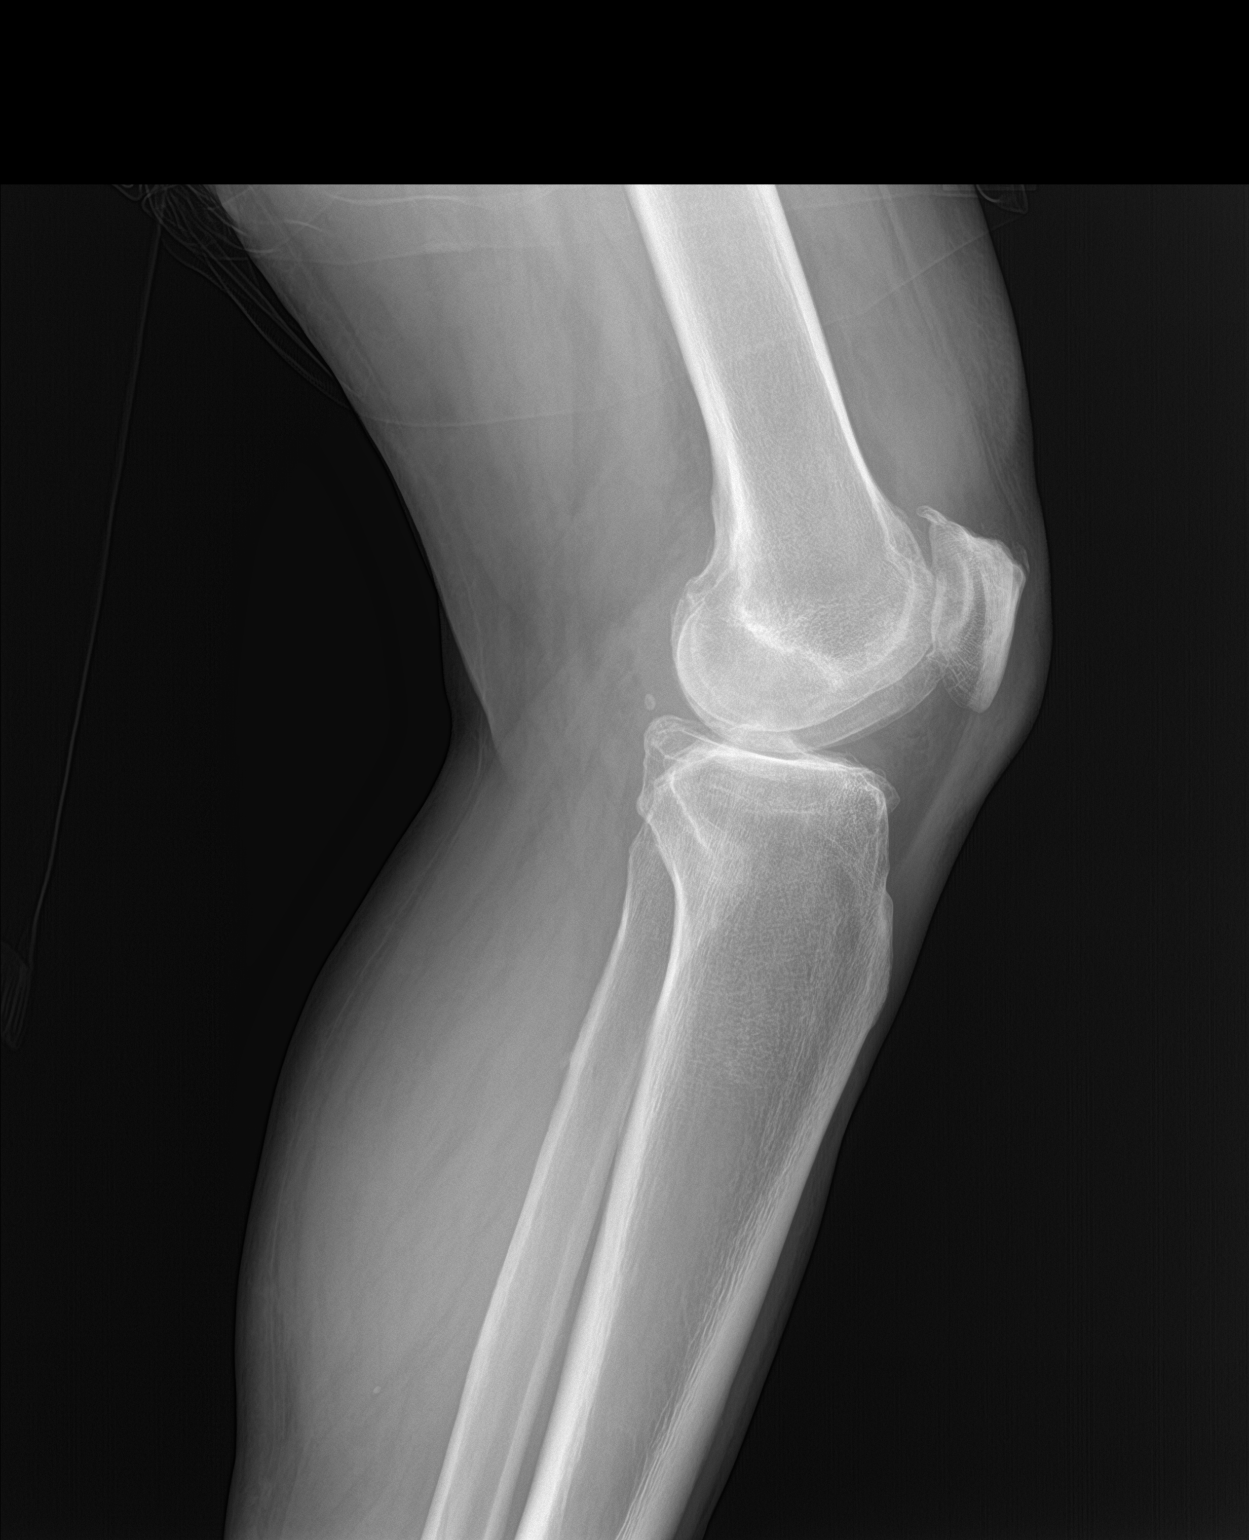

[knee sunrise]
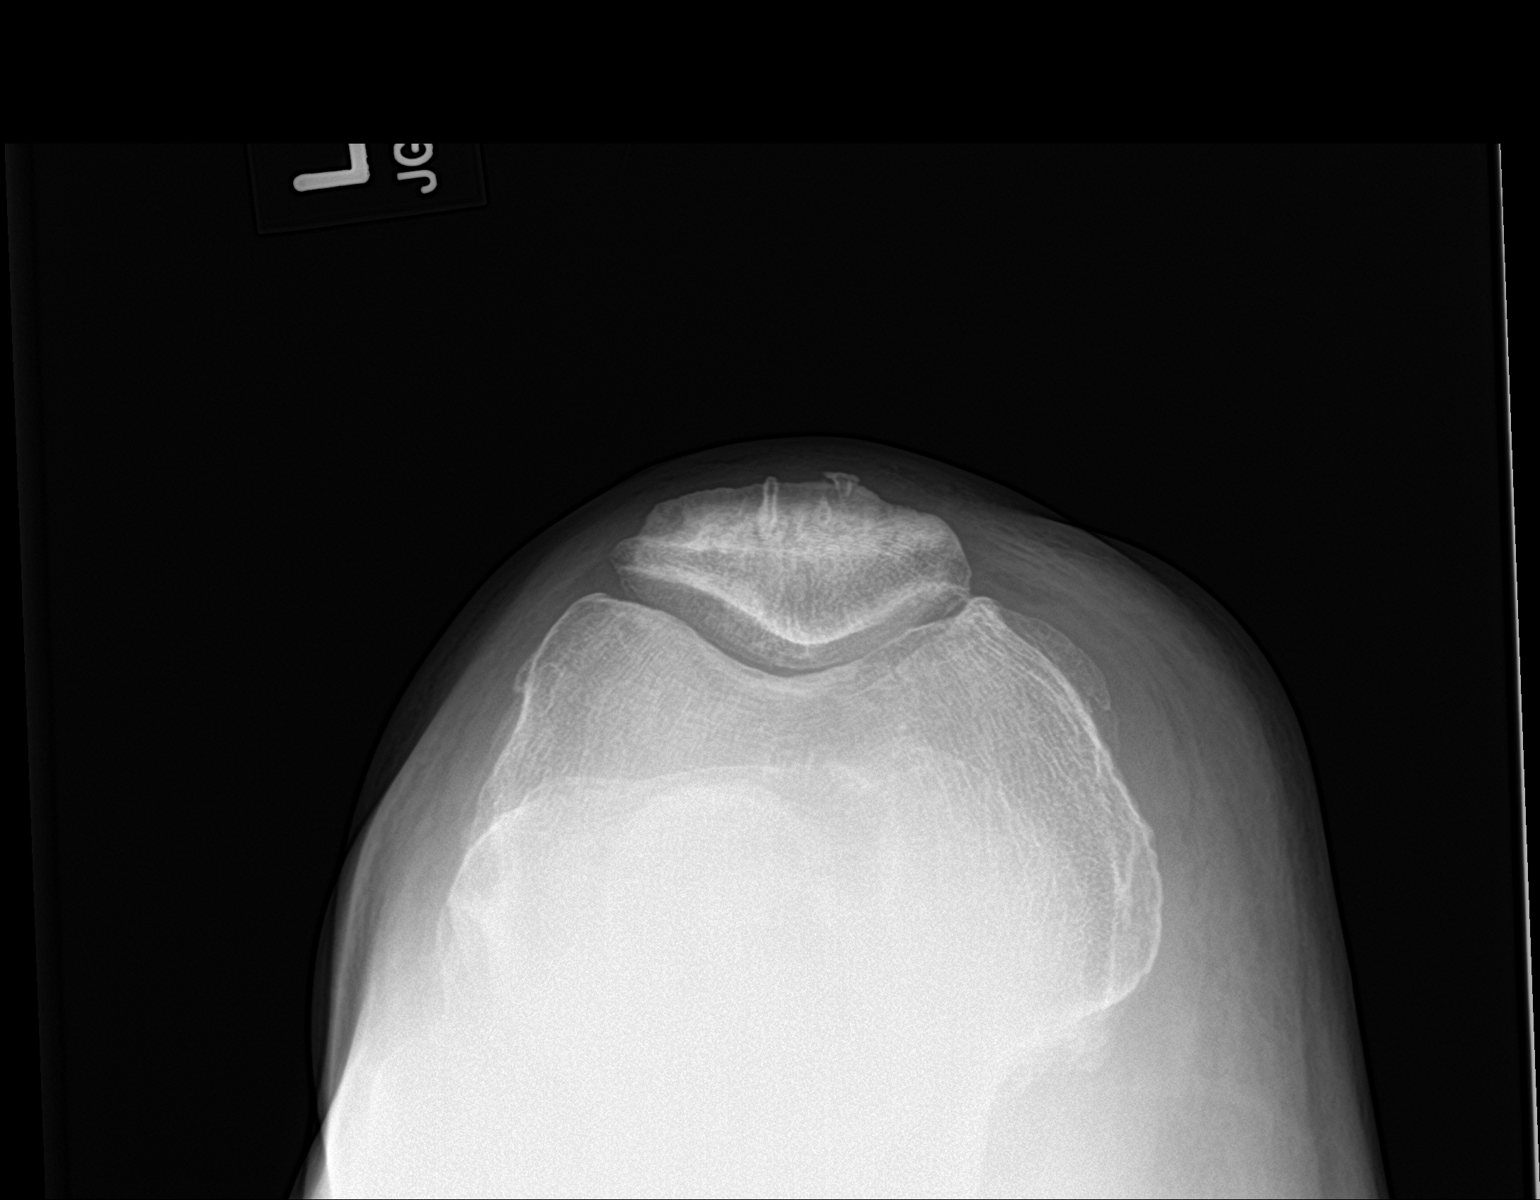

[3 of 3 positions shown; findings below may reference images not displayed]

FINDINGS: Small suprapatellar left knee joint effusion. No fracture,
dislocation or suspicious focal osseous lesions. Small superior left
patellar enthesophyte. Mild-to-moderate tricompartmental left knee
osteoarthritis, most prominent in the medial and patellofemoral
compartments. No radiopaque foreign bodies.
IMPRESSION: Small suprapatellar left knee joint effusion. Mild-to-moderate
tricompartmental left knee osteoarthritis, most prominent in the
medial and patellofemoral compartments.

## 2023-06-01 ENCOUNTER — Ambulatory Visit (HOSPITAL_COMMUNITY)
Admission: EM | Admit: 2023-06-01 | Discharge: 2023-06-01 | Disposition: A | Discharge status: Home / Self Care | Payer: Commercial Managed Care - PPO | Attending: Family Medicine | Admitting: Family Medicine

## 2023-06-01 ENCOUNTER — Encounter (HOSPITAL_COMMUNITY): Payer: Self-pay | Admitting: Emergency Medicine

## 2023-06-01 DIAGNOSIS — J069 Acute upper respiratory infection, unspecified: Secondary | ICD-10-CM | POA: Diagnosis not present

## 2023-06-01 MED ORDER — BENZONATATE 200 MG PO CAPS: 200.0000 mg | ORAL_CAPSULE | Freq: Three times a day (TID) | ORAL | 0 refills | Status: DC | PRN

## 2023-06-01 NOTE — Discharge Instructions (Signed)
You were seen today for upper respiratory symptoms.  This is likely viral at this time.  I have sent out a medication to help with a cough.  I recommend you get plenty of rest and fluids.  If not improving or worsening by next week then please return for re-evaluation.

## 2023-06-01 NOTE — ED Provider Notes (Signed)
[facility]    CSN: [id number] Arrival date & time: 06/01/23  0941      History   Chief Complaint Chief Complaint  Patient presents with   Cough    HPI Troy Gonzalez is a 67 y.o. male.    Cough Associated symptoms: rhinorrhea   Associated symptoms: no shortness of breath and no wheezing    Patient is here for not feeling well.  Earlier in the week had diarrhea, which has resolved.  Then started with cough and some phlegm.   No sob/wheezing per se.   Mild runny nose.  Also having headaches.   No fevers/chills.  No n/v.   He has been having some abd pain/cramping which he believes is due to coughing.  He did try mucinex without much help.  Some decreased appetite, but did eat a biscuit this morning.        Past Medical History:  Diagnosis Date   History of chicken pox     Patient Active Problem List   Diagnosis Date Noted   Diabetes mellitus without complication (HCC) 08/20/2019    Past Surgical History:  Procedure Laterality Date   CIRCUMCISION  09/22/2011   Procedure: CIRCUMCISION ADULT;  Surgeon: Lindaann Slough, MD;  Location: [facility] [place];  Service: Urology;  Laterality: N/A;   none         Home Medications    Prior to Admission medications   Medication Sig Start Date End Date Taking? Authorizing Provider  cetirizine (ZYRTEC) 10 MG tablet Take 1 tablet (10 mg total) by mouth daily. 03/11/22 04/10/22  Allwardt, Crist Infante, PA-C  diphenhydrAMINE (BENADRYL) 25 mg capsule Take 1 capsule (25 mg total) by mouth every 6 (six) hours as needed. 03/11/22   Allwardt, Crist Infante, PA-C    Family History Family History  Problem Relation Age of Onset   Asthma Mother    Kidney disease Sister    Stroke Maternal Grandmother     Social History Social History   Tobacco Use   Smoking status: Never   Smokeless tobacco: Never  Substance Use Topics   Alcohol use: No   Drug use: No     Allergies   Patient has no known  allergies.   Review of Systems Review of Systems  Constitutional:  Positive for appetite change.  HENT:  Positive for rhinorrhea.   Respiratory:  Positive for cough. Negative for shortness of breath and wheezing.   Gastrointestinal:  Positive for nausea.  Genitourinary: Negative.   Musculoskeletal: Negative.   Psychiatric/Behavioral: Negative.       Physical Exam Triage Vital Signs ED Triage Vitals  Encounter Vitals Group     BP 06/01/23 1131 122/78     Systolic BP Percentile --      Diastolic BP Percentile --      Pulse Rate 06/01/23 1131 69     Resp 06/01/23 1131 15     Temp 06/01/23 1131 98.6 F (37 C)     Temp Source 06/01/23 1131 Oral     SpO2 06/01/23 1131 98 %     Weight --      Height --      Head Circumference --      Peak Flow --      Pain Score 06/01/23 1130 0     Pain Loc --      Pain Education --      Exclude from Growth Chart --    No data found.  Updated Vital  Signs BP 122/78 (BP Location: Left Arm)   Pulse 69   Temp 98.6 F (37 C) (Oral)   Resp 15   SpO2 98%   Visual Acuity Right Eye Distance:   Left Eye Distance:   Bilateral Distance:    Right Eye Near:   Left Eye Near:    Bilateral Near:     Physical Exam Constitutional:      General: He is not in acute distress.    Appearance: Normal appearance. He is normal weight. He is not ill-appearing.  HENT:     Nose: Nose normal. No congestion or rhinorrhea.     Mouth/Throat:     Mouth: Mucous membranes are moist.  Cardiovascular:     Rate and Rhythm: Normal rate and regular rhythm.  Pulmonary:     Effort: Pulmonary effort is normal.     Breath sounds: Normal breath sounds. No wheezing or rhonchi.  Musculoskeletal:     Cervical back: Normal range of motion and neck supple. No tenderness.  Neurological:     General: No focal deficit present.     Mental Status: He is alert.  Psychiatric:        Mood and Affect: Mood normal.      UC Treatments / Results  Labs (all labs ordered  are listed, but only abnormal results are displayed) Labs Reviewed - No data to display  EKG   Radiology No results found.  Procedures Procedures (including critical care time)  Medications Ordered in UC Medications - No data to display  Initial Impression / Assessment and Plan / UC Course  I have reviewed the triage vital signs and the nursing notes.  Pertinent labs & imaging results that were available during my care of the patient were reviewed by me and considered in my medical decision making (see chart for details).   Final Clinical Impressions(s) / UC Diagnoses   Final diagnoses:  Viral URI with cough     Discharge Instructions      You were seen today for upper respiratory symptoms.  This is likely viral at this time.  I have sent out a medication to help with a cough.  I recommend you get plenty of rest and fluids.  If not improving or worsening by next week then please return for re-evaluation.      ED Prescriptions     Medication Sig Dispense Auth. Provider   benzonatate (TESSALON) 200 MG capsule Take 1 capsule (200 mg total) by mouth 3 (three) times daily as needed for cough. 21 capsule Jannifer Franklin, MD      PDMP not reviewed this encounter.   Jannifer Franklin, MD 06/01/23 1150

## 2023-06-01 NOTE — ED Triage Notes (Signed)
Pt reports Tuesday/Wed had diarrhea. Pt had cough that getting up little phlegm started Tuesday when woke up. Reports pains in abd from coughing. Took Mucinex.

## 2023-06-29 ENCOUNTER — Other Ambulatory Visit: Payer: Self-pay | Admitting: Family Medicine

## 2023-06-29 DIAGNOSIS — Z1211 Encounter for screening for malignant neoplasm of colon: Secondary | ICD-10-CM

## 2023-06-29 DIAGNOSIS — Z1212 Encounter for screening for malignant neoplasm of rectum: Secondary | ICD-10-CM

## 2023-07-10 LAB — COLOGUARD: COLOGUARD: NEGATIVE

## 2023-10-12 ENCOUNTER — Encounter: Payer: PRIVATE HEALTH INSURANCE | Admitting: Family Medicine

## 2023-10-19 ENCOUNTER — Encounter: Payer: PRIVATE HEALTH INSURANCE | Admitting: Family Medicine

## 2023-10-29 ENCOUNTER — Encounter: Payer: Self-pay | Admitting: Family Medicine

## 2023-10-29 ENCOUNTER — Ambulatory Visit (INDEPENDENT_AMBULATORY_CARE_PROVIDER_SITE_OTHER): Payer: PRIVATE HEALTH INSURANCE | Admitting: Family Medicine

## 2023-10-29 VITALS — BP 124/76 | HR 73 | Temp 98.3°F | Ht 73.0 in | Wt 218.4 lb

## 2023-10-29 DIAGNOSIS — E119 Type 2 diabetes mellitus without complications: Secondary | ICD-10-CM

## 2023-10-29 DIAGNOSIS — Z0000 Encounter for general adult medical examination without abnormal findings: Secondary | ICD-10-CM

## 2023-10-29 DIAGNOSIS — Z0001 Encounter for general adult medical examination with abnormal findings: Secondary | ICD-10-CM

## 2023-10-29 DIAGNOSIS — M7052 Other bursitis of knee, left knee: Secondary | ICD-10-CM

## 2023-10-29 DIAGNOSIS — R202 Paresthesia of skin: Secondary | ICD-10-CM

## 2023-10-29 DIAGNOSIS — Z125 Encounter for screening for malignant neoplasm of prostate: Secondary | ICD-10-CM

## 2023-10-29 LAB — COMPREHENSIVE METABOLIC PANEL
ALT: 21 U/L (ref 0–53)
AST: 18 U/L (ref 0–37)
Albumin: 4 g/dL (ref 3.5–5.2)
Alkaline Phosphatase: 57 U/L (ref 39–117)
BUN: 14 mg/dL (ref 6–23)
CO2: 26 meq/L (ref 19–32)
Calcium: 8.8 mg/dL (ref 8.4–10.5)
Chloride: 107 meq/L (ref 96–112)
Creatinine, Ser: 1.13 mg/dL (ref 0.40–1.50)
GFR: 66.8 mL/min (ref >60.00)
Glucose, Bld: 121 mg/dL — ABNORMAL HIGH (ref 70–99)
Potassium: 4.1 meq/L (ref 3.5–5.1)
Sodium: 142 meq/L (ref 135–145)
Total Bilirubin: 0.4 mg/dL (ref 0.2–1.2)
Total Protein: 7.5 g/dL (ref 6.0–8.3)

## 2023-10-29 LAB — CBC WITH DIFFERENTIAL/PLATELET
Basophils Absolute: 0 10*3/uL (ref 0.0–0.1)
Basophils Relative: 0.5 % (ref 0.0–3.0)
Eosinophils Absolute: 0.1 10*3/uL (ref 0.0–0.7)
Eosinophils Relative: 3.4 % (ref 0.0–5.0)
HCT: 40.7 % (ref 39.0–52.0)
Hemoglobin: 13.2 g/dL (ref 13.0–17.0)
Lymphocytes Relative: 37.2 % (ref 12.0–46.0)
Lymphs Abs: 1 10*3/uL (ref 0.7–4.0)
MCHC: 32.4 g/dL (ref 30.0–36.0)
MCV: 94.1 fL (ref 78.0–100.0)
Monocytes Absolute: 0.3 10*3/uL (ref 0.1–1.0)
Monocytes Relative: 9.4 % (ref 3.0–12.0)
Neutro Abs: 1.3 10*3/uL — ABNORMAL LOW (ref 1.4–7.7)
Neutrophils Relative %: 49.5 % (ref 43.0–77.0)
Platelets: 119 10*3/uL — ABNORMAL LOW (ref 150.0–400.0)
RBC: 4.32 Mil/uL (ref 4.22–5.81)
RDW: 14.4 % (ref 11.5–15.5)
WBC: 2.7 10*3/uL — ABNORMAL LOW (ref 4.0–10.5)

## 2023-10-29 LAB — LIPID PANEL
Cholesterol: 180 mg/dL (ref 0–200)
HDL: 44.6 mg/dL (ref >39.00)
LDL Cholesterol: 120 mg/dL — ABNORMAL HIGH (ref 0–99)
NonHDL: 135.06
Total CHOL/HDL Ratio: 4
Triglycerides: 74 mg/dL (ref 0.0–149.0)
VLDL: 14.8 mg/dL (ref 0.0–40.0)

## 2023-10-29 LAB — MICROALBUMIN / CREATININE URINE RATIO
Creatinine,U: 154.1 mg/dL
Microalb Creat Ratio: 0.5 mg/g (ref 0.0–30.0)
Microalb, Ur: <0.7 mg/dL (ref 0.0–1.9)

## 2023-10-29 LAB — PSA: PSA: 0.78 ng/mL (ref 0.10–4.00)

## 2023-10-29 LAB — TSH: TSH: 1.21 u[IU]/mL (ref 0.35–5.50)

## 2023-10-29 LAB — VITAMIN B12: Vitamin B-12: 192 pg/mL — ABNORMAL LOW (ref 211–911)

## 2023-10-29 LAB — HEMOGLOBIN A1C: Hgb A1c MFr Bld: 7.1 % — ABNORMAL HIGH (ref 4.6–6.5)

## 2023-10-29 NOTE — Progress Notes (Signed)
Established Patient Office Visit   Subjective  Patient ID: Troy Gonzalez, male    DOB: 09/26/1955  Age: 68 y.o. MRN: [id number]  Chief Complaint  Patient presents with   Annual Exam    Patient is a 68 year old male seen for CPE.  Patient states has been doing well overall.  Mentions intermittent discomfort of left knee below patella.  States knee does not feel unstable and does not swell.  Feels similar to when pt hyperextended his left knee years ago.  Patient denies increased activity or injury.  Occasionally stretches.  Patient also notes intermittent numbness and tingling of bilateral forearms from elbows distally and in posterior legs bilaterally with sitting or leaning with elbows on thighs.  Can also occur with laying down at night.  Patient trying to move his wallet from his back pocket to front pocket and decreased the a number of items in it.    Patient Active Problem List   Diagnosis Date Noted   Diabetes mellitus without complication (HCC) 08/20/2019   Past Medical History:  Diagnosis Date   History of chicken pox    Past Surgical History:  Procedure Laterality Date   CIRCUMCISION  09/22/2011   Procedure: CIRCUMCISION ADULT;  Surgeon: Lindaann Slough, MD;  Location: [facility] ;  Service: Urology;  Laterality: N/A;   none     Social History   Tobacco Use   Smoking status: Never   Smokeless tobacco: Never  Substance Use Topics   Alcohol use: No   Drug use: No   Family History  Problem Relation Age of Onset   Asthma Mother    Kidney disease Sister    Stroke Maternal Grandmother    No Known Allergies    ROS Negative unless stated above    Objective:     BP 124/76 (BP Location: Left Arm, Patient Position: Sitting, Cuff Size: Large)   Pulse 73   Temp 98.3 F (36.8 C) (Oral)   Ht 6\' 1"  (1.854 m)   Wt 218 lb 6.4 oz (99.1 kg)   SpO2 98%   BMI 28.81 kg/m  BP Readings from Last 3 Encounters:  10/29/23 124/76  06/01/23 122/78   10/09/22 122/84   Wt Readings from Last 3 Encounters:  10/29/23 218 lb 6.4 oz (99.1 kg)  10/09/22 217 lb 3.2 oz (98.5 kg)  10/06/21 219 lb 9.6 oz (99.6 kg)      Physical Exam Constitutional:      Appearance: Normal appearance.  HENT:     Head: Normocephalic and atraumatic.     Right Ear: Tympanic membrane, ear canal and external ear normal.     Left Ear: Tympanic membrane, ear canal and external ear normal.     Nose: Nose normal.     Mouth/Throat:     Mouth: Mucous membranes are moist.     Pharynx: No oropharyngeal exudate or posterior oropharyngeal erythema.  Eyes:     General: No scleral icterus.    Extraocular Movements: Extraocular movements intact.     Conjunctiva/sclera: Conjunctivae normal.     Pupils: Pupils are equal, round, and reactive to light.  Neck:     Thyroid: No thyromegaly.  Cardiovascular:     Rate and Rhythm: Normal rate and regular rhythm.     Pulses: Normal pulses.     Heart sounds: Normal heart sounds. No murmur heard.    No friction rub.  Pulmonary:     Effort: Pulmonary effort is normal.     Breath  sounds: Normal breath sounds. No wheezing, rhonchi or rales.  Abdominal:     General: Bowel sounds are normal.     Palpations: Abdomen is soft.     Tenderness: There is no abdominal tenderness.  Musculoskeletal:        General: No deformity. Normal range of motion.     Right knee: Normal. No bony tenderness or crepitus. Normal range of motion. No tenderness.     Left knee: No bony tenderness or crepitus. Normal range of motion. No tenderness.     Comments: Minimal effusion of left knee.  No TTP of joint line or patella.  Lymphadenopathy:     Cervical: No cervical adenopathy.  Skin:    General: Skin is warm and dry.     Findings: No lesion.  Neurological:     General: No focal deficit present.     Mental Status: He is alert and oriented to person, place, and time.  Psychiatric:        Mood and Affect: Mood normal.        Thought Content:  Thought content normal.       10/29/2023   11:10 AM 10/09/2022   10:57 AM 10/06/2021    3:08 PM  Depression screen PHQ 2/9  Decreased Interest 0 0 0  Down, Depressed, Hopeless 0 0 0  PHQ - 2 Score 0 0 0  Altered sleeping 0 0 0  Tired, decreased energy 0 0 0  Change in appetite 0 0 0  Feeling bad or failure about yourself  0 0 0  Trouble concentrating 0 0 0  Moving slowly or fidgety/restless 0 0 0  Suicidal thoughts 0 0 0  PHQ-9 Score 0 0 0      10/29/2023   11:11 AM  GAD 7 : Generalized Anxiety Score  Nervous, Anxious, on Edge 0  Control/stop worrying 0  Worry too much - different things 0  Trouble relaxing 0  Restless 0  Easily annoyed or irritable 0  Afraid - awful might happen 0  Total GAD 7 Score 0      No results found for any visits on 10/29/23.    Assessment & Plan:  Encounter for routine adult health examination with abnormal findings -Anticipatory guidance given including wearing seatbelts, smoke detectors in the home, increasing physical activity, increasing p.o. intake of water and vegetables. -Labs -Cologuard normal on 07/10/2023 -Obtain PSA this visit -Immunizations reviewed.  Consider pneumonia vaccine and second dose of shingles.  COVID and influenza vaccines given 06/26/2023 at pharmacy. -     CBC with Differential/Platelet; Future -     Comprehensive metabolic panel; Future -     Lipid panel; Future  Diabetes mellitus without complication (HCC) -controlled -hgb A1c 7.1% on 10/09/22 -diet controlled -continue lifestyle modifications -for elevation start meds -statin recommended given past elevation in lipids -foot exam at next [facility]. -eye exam done a few wks ago. -     Comprehensive metabolic panel; Future -     Hemoglobin A1c; Future -     Microalbumin / creatinine urine ratio  Infrapatellar bursitis of left knee -supportive care with topical analgesics, stretching, or NSAIDs as needed -Given handouts -For continued or worsening symptoms  consider sports med referral -     CBC with Differential/Platelet; Future  Screening for prostate cancer -     PSA; Future  Paresthesia -Discussed causes including compression from leaning on elbows or thighs/sitting on hard surfaces -Advised to continue putting wallet in front pocket instead  of rib pockets. -Avoid prolonged sitting without taking breaks -Discussed stretching and balanced diet -Obtain labs -     CBC with Differential/Platelet; Future -     Comprehensive metabolic panel; Future -     TSH; Future -     Vitamin B12; Future    Return in about 6 weeks (around 12/10/2023) for continued jknee pain, Diabetic foot exam.   Deeann Saint, MD

## 2023-10-30 ENCOUNTER — Encounter: Payer: Self-pay | Admitting: Family Medicine

## 2023-12-07 DIAGNOSIS — H168 Other keratitis: Secondary | ICD-10-CM | POA: Diagnosis not present

## 2023-12-10 ENCOUNTER — Ambulatory Visit (INDEPENDENT_AMBULATORY_CARE_PROVIDER_SITE_OTHER): Payer: PRIVATE HEALTH INSURANCE | Admitting: Family Medicine

## 2023-12-10 ENCOUNTER — Encounter: Payer: Self-pay | Admitting: Family Medicine

## 2023-12-10 VITALS — BP 138/76 | HR 73 | Temp 98.5°F | Ht 73.0 in | Wt 216.6 lb

## 2023-12-10 DIAGNOSIS — E119 Type 2 diabetes mellitus without complications: Secondary | ICD-10-CM

## 2023-12-10 DIAGNOSIS — G8929 Other chronic pain: Secondary | ICD-10-CM

## 2023-12-10 DIAGNOSIS — M25562 Pain in left knee: Secondary | ICD-10-CM | POA: Diagnosis not present

## 2023-12-10 DIAGNOSIS — E538 Deficiency of other specified B group vitamins: Secondary | ICD-10-CM | POA: Diagnosis not present

## 2023-12-10 DIAGNOSIS — S0501XD Injury of conjunctiva and corneal abrasion without foreign body, right eye, subsequent encounter: Secondary | ICD-10-CM

## 2023-12-10 DIAGNOSIS — H168 Other keratitis: Secondary | ICD-10-CM | POA: Diagnosis not present

## 2023-12-10 MED ORDER — CYANOCOBALAMIN 1000 MCG/ML IJ SOLN
1000.0000 ug | Freq: Once | INTRAMUSCULAR | Status: AC
  Administered 2023-12-10: 1000 ug via INTRAMUSCULAR

## 2023-12-10 NOTE — Patient Instructions (Addendum)
 It appears that they do make diabetic work boots.

## 2023-12-10 NOTE — Progress Notes (Signed)
 Established Patient Office Visit   Subjective  Patient ID: Troy Gonzalez, male    DOB: [birth year]  Age: 68 y.o. MRN: [id number]  Chief Complaint  Patient presents with   Medical Management of Chronic Issues    6 month follow-up Knee pain still having pain, Foot Exam    Patient is a 68 year old male seen for follow-up.  Patient states he has been doing well overall.  Still having occasional knee pain.  Knee is not hurting today.  Will use topical medicine like Biofreeze as needed.  Denies decreased ROM or edema in knee.  Patient mentions rubbing his eye then having a burning sensation/discomfort.  Seen by ophthalmology.  Given drops for small scratches on eye.  Has follow-up appointment later today.    Patient Active Problem List   Diagnosis Date Noted   Diabetes mellitus without complication (HCC) 08/20/2019   Past Medical History:  Diagnosis Date   History of chicken pox    Past Surgical History:  Procedure Laterality Date   CIRCUMCISION  09/22/2011   Procedure: CIRCUMCISION ADULT;  Surgeon: Lindaann Slough, MD;  Location: [facility];  Service: Urology;  Laterality: N/A;   none     Social History   Tobacco Use   Smoking status: Never   Smokeless tobacco: Never  Substance Use Topics   Alcohol use: No   Drug use: No   Family History  Problem Relation Age of Onset   Asthma Mother    Kidney disease Sister    Stroke Maternal Grandmother    No Known Allergies    ROS Negative unless stated above    Objective:     BP 138/76 (BP Location: Left Arm, Patient Position: Sitting, Cuff Size: Normal)   Pulse 73   Temp 98.5 F (36.9 C) (Oral)   Ht 6\' 1"  (1.854 m)   Wt 216 lb 9.6 oz (98.2 kg)   SpO2 98%   BMI 28.58 kg/m  BP Readings from Last 3 Encounters:  12/10/23 138/76  10/29/23 124/76  06/01/23 122/78   Wt Readings from Last 3 Encounters:  12/10/23 216 lb 9.6 oz (98.2 kg)  10/29/23 218 lb 6.4 oz (99.1 kg)  10/09/22 217 lb 3.2 oz (98.5  kg)      Physical Exam Constitutional:      General: He is not in acute distress.    Appearance: Normal appearance.  HENT:     Head: Normocephalic and atraumatic.     Nose: Nose normal.     Mouth/Throat:     Mouth: Mucous membranes are moist.  Cardiovascular:     Rate and Rhythm: Normal rate and regular rhythm.     Heart sounds: Normal heart sounds. No murmur heard.    No gallop.  Pulmonary:     Effort: Pulmonary effort is normal. No respiratory distress.     Breath sounds: Normal breath sounds. No wheezing, rhonchi or rales.  Skin:    General: Skin is warm and dry.  Neurological:     Mental Status: He is alert and oriented to person, place, and time.     Diabetic Foot Exam - Simple   Simple Foot Form Diabetic Foot exam was performed with the following findings: Yes 12/10/2023 10:34 AM  Visual Inspection No deformities, no ulcerations, no other skin breakdown bilaterally: Yes See comments: Yes Sensation Testing Intact to touch and monofilament testing bilaterally: Yes Pulse Check Posterior Tibialis and Dorsalis pulse intact bilaterally: Yes Comments Patient with callus of left medial  foot at first MCP joint.     No results found for any visits on 12/10/23.    Assessment & Plan:  Chronic pain of left knee  Diabetes mellitus without complication (HCC) -     For Home Use Only DME Diabetic Shoe  Vitamin B12 deficiency  Abrasion of right cornea, subsequent encounter  Left knee pain stable.  Continue supportive care with OTC topical analgesics, Tylenol as needed, heat, compression, etc.  Consider x-ray for continued or worsening symptoms.  Last x-ray 06/20/2021 with small suprapatellar left knee joint effusion.  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  Patient with diet-controlled DM.  A1c 7.1% on 10/29/2023.  Foot exam done this visit.  Eye exam done last week.  Patient to consider high intensity statin given 10-year CVD risk of 19.03%.  Consider  diabetic workbooks.  Vitamin B12 injection given this visit as vitamin B12 level 196 on 10/29/2023.  Continue monthly B12 injections x 3 then okay to take OTC vitamin B12 1000-2000 IUs daily.  Continue drops for corneal abrasion.  Continue follow-up with ophthalmology.  Return in about 5 months (around 05/11/2024) for diabetes, chronic conditions.   Deeann Saint, MD

## 2023-12-10 NOTE — Addendum Note (Signed)
 Addended by: Philipp Deputy A on: 12/10/2023 11:15 AM   Modules accepted: Orders

## 2023-12-17 DIAGNOSIS — H5203 Hypermetropia, bilateral: Secondary | ICD-10-CM | POA: Diagnosis not present

## 2023-12-17 DIAGNOSIS — H524 Presbyopia: Secondary | ICD-10-CM | POA: Diagnosis not present

## 2024-03-21 ENCOUNTER — Encounter (HOSPITAL_COMMUNITY): Payer: Self-pay | Admitting: *Deleted

## 2024-03-21 ENCOUNTER — Ambulatory Visit (HOSPITAL_COMMUNITY)
Admission: EM | Admit: 2024-03-21 | Discharge: 2024-03-21 | Disposition: A | Discharge status: Home / Self Care | Attending: Nurse Practitioner | Admitting: Nurse Practitioner

## 2024-03-21 ENCOUNTER — Ambulatory Visit (INDEPENDENT_AMBULATORY_CARE_PROVIDER_SITE_OTHER)

## 2024-03-21 DIAGNOSIS — R918 Other nonspecific abnormal finding of lung field: Secondary | ICD-10-CM | POA: Diagnosis not present

## 2024-03-21 DIAGNOSIS — R531 Weakness: Secondary | ICD-10-CM

## 2024-03-21 DIAGNOSIS — R509 Fever, unspecified: Secondary | ICD-10-CM

## 2024-03-21 DIAGNOSIS — J189 Pneumonia, unspecified organism: Secondary | ICD-10-CM

## 2024-03-21 DIAGNOSIS — R059 Cough, unspecified: Secondary | ICD-10-CM | POA: Diagnosis not present

## 2024-03-21 LAB — POC SARS CORONAVIRUS 2 AG -  ED: SARS Coronavirus 2 Ag: NEGATIVE

## 2024-03-21 LAB — POCT URINALYSIS DIP (MANUAL ENTRY)
Bilirubin, UA: NEGATIVE
Glucose, UA: NEGATIVE mg/dL
Ketones, POC UA: NEGATIVE mg/dL
Leukocytes, UA: NEGATIVE
Nitrite, UA: NEGATIVE
Protein Ur, POC: NEGATIVE mg/dL
Spec Grav, UA: 1.02 (ref 1.010–1.025)
Urobilinogen, UA: 0.2 U/dL
pH, UA: 5.5 (ref 5.0–8.0)

## 2024-03-21 LAB — POCT FASTING CBG KUC MANUAL ENTRY: POCT Glucose (KUC): 110 mg/dL — AB (ref 70–99)

## 2024-03-21 MED ORDER — AZITHROMYCIN 500 MG PO TABS: 500.0000 mg | ORAL_TABLET | Freq: Every day | ORAL | 0 refills | Status: AC

## 2024-03-21 MED ORDER — AMOXICILLIN-POT CLAVULANATE 875-125 MG PO TABS: 1.0000 | ORAL_TABLET | Freq: Two times a day (BID) | ORAL | 0 refills | Status: DC

## 2024-03-21 MED ORDER — MUCINEX DM MAXIMUM STRENGTH 60-1200 MG PO TB12: 1.0000 | ORAL_TABLET | Freq: Two times a day (BID) | ORAL | 0 refills | Status: DC

## 2024-03-21 NOTE — Discharge Instructions (Signed)
 You were seen today for generalized weakness, chills, and a low-grade fever that started yesterday. You also reported a mild, intermittent cough that sometimes makes it briefly difficult to catch your breath, although you are not experiencing shortness of breath at rest or other upper respiratory symptoms. Your evaluation included a chest X-ray, urinalysis, blood sugar check, and COVID-19 test. The urinalysis and blood sugar were normal, and your COVID test was negative. Your chest X-ray showed mild lung changes that may represent early infection.   Based on your symptoms and imaging, you were prescribed Augmentin and azithromycin to treat a possible early pneumonia. It is important to take all medications exactly as directed, even if you begin to feel better. Drink plenty of fluids, get adequate rest, and avoid strenuous activity while your body recovers.  Monitor your symptoms closely. Go to the emergency room if you develop a high fever over 101F, worsening cough, chest pain, shortness of breath, confusion, or if you are unable to keep down fluids.   Follow up with your primary care provider in 5-7 days or sooner if your symptoms do not improve.

## 2024-03-21 NOTE — ED Triage Notes (Signed)
 Pt states he woke up yesterday morning feeling weak, he went to work today but felt too weak to stay at work. He denies any other sx. He hasn't taken any meds

## 2024-03-21 NOTE — ED Provider Notes (Signed)
 [facility]    CSN: [id number] Arrival date & time: 03/21/24  9160      History   Chief Complaint Chief Complaint  Patient presents with   Weakness    HPI Troy Gonzalez is a 68 y.o. male.   Discussed the use of AI scribe software for clinical note transcription with the patient, who gave verbal consent to proceed.   Troy Gonzalez is a 68 y.o. male that presents with generalized weakness that started yesterday. He reports feeling weak all over his body, which began when he woke up yesterday morning. The patient states he felt fine when he went to bed the night before but woke up feeling weak, which led him to miss work yesterday. Associated symptoms include chills, which he experienced yesterday, and a mild cough that has been ongoing for the past couple of days. The patient mentions that when he coughs, it takes a while for his breathing to return to normal. However, he denies shortness of breath when not coughing. Troy Gonzalez also reports feeling tired or fatigued every now and then. He denies nausea, vomiting, stomach pain, numbness or tingling in his face, hands, or feet, dizziness, headaches, blurred vision, chest pain, or palpitations. The patient states he has not been around anyone sick recently. The patient's appetite remains unchanged, and he reports eating and drinking normally. He denies any runny nose, congestion, or other upper respiratory symptoms. He denies any previous medical problems and does not take any daily medications.   The following portions of the patient's history were reviewed and updated as appropriate: allergies, current medications, past family history, past medical history, past social history, past surgical history, and problem list.    Past Medical History:  Diagnosis Date   History of chicken pox     Patient Active Problem List   Diagnosis Date Noted   Diabetes mellitus without complication (HCC) 08/20/2019    Past Surgical  History:  Procedure Laterality Date   CIRCUMCISION  09/22/2011   Procedure: CIRCUMCISION ADULT;  Surgeon: Thomasine Oiler, MD;  Location: [facility];  Service: Urology;  Laterality: N/A;       Home Medications    Prior to Admission medications   Medication Sig Start Date End Date Taking? Authorizing Provider  amoxicillin -clavulanate (AUGMENTIN) 875-125 MG tablet Take 1 tablet by mouth every 12 (twelve) hours. 03/21/24  Yes Iola Lukes, FNP  azithromycin (ZITHROMAX) 500 MG tablet Take 1 tablet (500 mg total) by mouth daily for 5 days. 03/21/24 03/26/24 Yes Mirza Fessel, Lukes, FNP  Dextromethorphan-guaiFENesin (MUCINEX DM MAXIMUM STRENGTH) 60-1200 MG TB12 Take 1 tablet by mouth 2 (two) times daily. 03/21/24  Yes Iola Lukes, FNP    Family History Family History  Problem Relation Age of Onset   Asthma Mother    Kidney disease Sister    Stroke Maternal Grandmother     Social History Social History   Tobacco Use   Smoking status: Never   Smokeless tobacco: Never  Vaping Use   Vaping status: Never Used  Substance Use Topics   Alcohol use: No   Drug use: No     Allergies   Patient has no known allergies.   Review of Systems Review of Systems  Constitutional:  Positive for fatigue (occasional). Negative for activity change, appetite change, chills and fever.  HENT:  Negative for congestion, rhinorrhea and sore throat.   Eyes:  Negative for visual disturbance.  Respiratory:  Positive for cough (occasional mildly productive). Negative for chest  tightness, shortness of breath and wheezing.   Cardiovascular:  Negative for chest pain, palpitations and leg swelling.  Gastrointestinal:  Negative for abdominal pain, nausea and vomiting.  Musculoskeletal:  Positive for myalgias (mild low back pain with couging).  Neurological:  Positive for weakness and headaches (mild). Negative for dizziness and numbness.  All other systems reviewed and are  negative.    Physical Exam Triage Vital Signs ED Triage Vitals  Encounter Vitals Group     BP 03/21/24 0913 133/78     Girls Systolic BP Percentile --      Girls Diastolic BP Percentile --      Boys Systolic BP Percentile --      Boys Diastolic BP Percentile --      Pulse Rate 03/21/24 0913 79     Resp 03/21/24 0913 16     Temp 03/21/24 0913 99.8 F (37.7 C)     Temp Source 03/21/24 0913 Oral     SpO2 03/21/24 0913 96 %     Weight --      Height --      Head Circumference --      Peak Flow --      Pain Score 03/21/24 0912 0     Pain Loc --      Pain Education --      Exclude from Growth Chart --    No data found.  Updated Vital Signs BP 133/78 (BP Location: Left Arm)   Pulse 79   Temp 99.8 F (37.7 C) (Oral)   Resp 16   SpO2 96%   Visual Acuity Right Eye Distance:   Left Eye Distance:   Bilateral Distance:    Right Eye Near:   Left Eye Near:    Bilateral Near:     Physical Exam Vitals reviewed.  Constitutional:      General: He is awake. He is not in acute distress.    Appearance: Normal appearance. He is well-developed. He is not ill-appearing, toxic-appearing or diaphoretic.  HENT:     Head: Normocephalic.     Mouth/Throat:     Mouth: Mucous membranes are moist.   Eyes:     General: Vision grossly intact.     Conjunctiva/sclera: Conjunctivae normal.    Cardiovascular:     Rate and Rhythm: Normal rate and regular rhythm.     Heart sounds: Normal heart sounds.  Pulmonary:     Effort: Pulmonary effort is normal.     Breath sounds: Normal breath sounds and air entry.   Musculoskeletal:        General: Normal range of motion.     Cervical back: Normal range of motion and neck supple.     Right lower leg: No edema.     Left lower leg: No edema.  Lymphadenopathy:     Cervical: No cervical adenopathy.   Skin:    General: Skin is warm and dry.   Neurological:     General: No focal deficit present.     Mental Status: He is alert and oriented  to person, place, and time.   Psychiatric:        Behavior: Behavior is cooperative.      UC Treatments / Results  Labs (all labs ordered are listed, but only abnormal results are displayed) Labs Reviewed  POCT URINALYSIS DIP (MANUAL ENTRY) - Abnormal; Notable for the following components:      Result Value   Blood, UA trace-lysed (*)    All other components  within normal limits  POCT FASTING CBG [facility] MANUAL ENTRY - Abnormal; Notable for the following components:   POCT Glucose ([facility]) 110 (*)    All other components within normal limits  POC SARS CORONAVIRUS 2 AG -  ED    EKG   Radiology DG Chest 2 View Result Date: 03/21/2024 CLINICAL DATA:  Cough, weakness. EXAM: CHEST - 2 VIEW COMPARISON:  November 01, 2017 FINDINGS: The heart size and mediastinal contours are within normal limits. Hypoinflation of the lungs is noted. Right lung is clear. Minimal left basilar subsegmental atelectasis or possibly infiltrate is noted. The visualized skeletal structures are unremarkable. IMPRESSION: Hypoinflation of the lungs. Minimal left basilar subsegmental atelectasis or possibly infiltrate. Electronically Signed   By: Lynwood Landy Raddle M.D.   On: 03/21/2024 09:59    Procedures Procedures (including critical care time)  Medications Ordered in UC Medications - No data to display  Initial Impression / Assessment and Plan / UC Course  I have reviewed the triage vital signs and the nursing notes.  Pertinent labs & imaging results that were available during my care of the patient were reviewed by me and considered in my medical decision making (see chart for details).     Patient presents with generalized weakness beginning yesterday, along with chills and a low-grade fever of 99.34F noted during today's visit, though no fevers have been recorded at home. He reports a mild, intermittent cough over the past couple of days, occasionally causing brief difficulty returning to normal breathing, but  denies shortness of breath at rest, nasal congestion, or other upper respiratory symptoms. No known recent sick contacts. While current findings are most consistent with a viral illness, further evaluation is warranted to rule out other causes. A chest X-ray, urinalysis, blood glucose check, and COVID-19 test were ordered. UA negative for nitrites, leukocytes, glucose or ketones. CBG within normal limits. COVID test was negative. CXR showed hypoinflation of the lungs and minimal left basilar subsegmental atelectasis or possibly infiltrate. Given symptom presentation with low grade fever and lack of any other objetive finidings, will empirically treat with CAP. Augmentin and azithromycin prescribed. Patient advised to drink plenty of fluids and rest. Monitor symptoms closely. He should follow up with PCP if symptoms persist or worsen, and to seek emergency care for high fever, difficulty breathing, chest pain, or other concerning symptoms.  Today's evaluation has revealed no signs of a dangerous process. Discussed diagnosis with patient and/or guardian. Patient and/or guardian aware of their diagnosis, possible red flag symptoms to watch out for and need for close follow up. Patient and/or guardian understands verbal and written discharge instructions. Patient and/or guardian comfortable with plan and disposition.  Patient and/or guardian has a clear mental status at this time, good insight into illness (after discussion and teaching) and has clear judgment to make decisions regarding their care  Documentation was completed with the aid of voice recognition software. Transcription may contain typographical errors. Final Clinical Impressions(s) / UC Diagnoses   Final diagnoses:  Generalized weakness  Low grade fever  Community acquired pneumonia, unspecified laterality     Discharge Instructions      You were seen today for generalized weakness, chills, and a low-grade fever that started yesterday.  You also reported a mild, intermittent cough that sometimes makes it briefly difficult to catch your breath, although you are not experiencing shortness of breath at rest or other upper respiratory symptoms. Your evaluation included a chest X-ray, urinalysis, blood sugar check, and COVID-19 test.  The urinalysis and blood sugar were normal, and your COVID test was negative. Your chest X-ray showed mild lung changes that may represent early infection.   Based on your symptoms and imaging, you were prescribed Augmentin and azithromycin to treat a possible early pneumonia. It is important to take all medications exactly as directed, even if you begin to feel better. Drink plenty of fluids, get adequate rest, and avoid strenuous activity while your body recovers.  Monitor your symptoms closely. Go to the emergency room if you develop a high fever over 101F, worsening cough, chest pain, shortness of breath, confusion, or if you are unable to keep down fluids.   Follow up with your primary care provider in 5-7 days or sooner if your symptoms do not improve.      ED Prescriptions     Medication Sig Dispense Auth. Provider   azithromycin (ZITHROMAX) 500 MG tablet Take 1 tablet (500 mg total) by mouth daily for 5 days. 5 tablet Arlys Scatena, [place], FNP   Dextromethorphan-guaiFENesin (MUCINEX DM MAXIMUM STRENGTH) 60-1200 MG TB12 Take 1 tablet by mouth 2 (two) times daily. 20 tablet Lashuna Tamashiro, FNP   amoxicillin -clavulanate (AUGMENTIN) 875-125 MG tablet Take 1 tablet by mouth every 12 (twelve) hours. 14 tablet Iola Lukes, FNP      PDMP not reviewed this encounter.   Iola Lukes, [place] 03/21/24 1014

## 2024-04-18 ENCOUNTER — Ambulatory Visit (INDEPENDENT_AMBULATORY_CARE_PROVIDER_SITE_OTHER): Payer: PRIVATE HEALTH INSURANCE | Admitting: Family Medicine

## 2024-04-18 ENCOUNTER — Ambulatory Visit (HOSPITAL_COMMUNITY)
Admission: RE | Admit: 2024-04-18 | Discharge: 2024-04-18 | Disposition: A | Discharge status: Home / Self Care | Source: Ambulatory Visit | Attending: Family Medicine | Admitting: Family Medicine

## 2024-04-18 ENCOUNTER — Ambulatory Visit: Payer: Self-pay

## 2024-04-18 VITALS — BP 118/80 | HR 74 | Temp 97.8°F | Ht 73.0 in | Wt 209.1 lb

## 2024-04-18 DIAGNOSIS — R600 Localized edema: Secondary | ICD-10-CM

## 2024-04-18 MED ORDER — APIXABAN 5 MG PO TABS: ORAL_TABLET | ORAL | 0 refills | Status: AC

## 2024-04-18 NOTE — Telephone Encounter (Signed)
 FYI Only or Action Required?: FYI only for provider.  Patient was last seen in primary care on 12/10/2023 by Troy Clotilda SAUNDERS, MD.  Called Nurse Triage reporting Leg Swelling.  Symptoms began several days ago.  Interventions attempted: Nothing.  Symptoms are: gradually worsening.  Triage Disposition: See HCP Within 4 Hours (Or PCP Triage)  Patient/caregiver understands and will follow disposition?: Yes                             Copied from CRM #[id number]. Topic: Clinical - Red Word Triage >> Apr 18, 2024  9:56 AM Deaijah H wrote: Red Word that prompted transfer to Nurse Triage: Leg/Ankle swollen (goes down at night) Reason for Disposition  [1] Thigh, calf, or ankle swelling AND [2] only 1 side  Answer Assessment - Initial Assessment Questions 1. ONSET: When did the swelling start? (e.g., minutes, hours, days)     A couple of days ago  2. LOCATION: What part of the leg is swollen?  Are both legs swollen or just one leg?     Left leg, states swelling is mostly localized to ankle and below knee 3. SEVERITY: How bad is the swelling? (e.g., localized; mild, moderate, severe)     Localized/mild to moderate 4. REDNESS: Is there redness or signs of infection?     Denies 5. PAIN: Is the swelling painful to touch? If Yes, ask: How painful is it?   (Scale 1-10; mild, moderate or severe)     States pain started a couple of weeks ago, describes pain as cramping, states pain is not excruciating  6. FEVER: Do you have a fever? If Yes, ask: What is it, how was it measured, and when did it start?      Denies 7. CAUSE: What do you think is causing the leg swelling?     Unsure 8. MEDICAL HISTORY: Do you have a history of blood clots (e.g., DVT), cancer, heart failure, kidney disease, or liver failure?     Denies history of blood clots  9. RECURRENT SYMPTOM: Have you had leg swelling before? If Yes, ask: When was the last time? What happened  that time?     Denies 10. OTHER SYMPTOMS: Do you have any other symptoms? (e.g., chest pain, difficulty breathing)       States he is able to walk normally, denies difficulty breathing, denies chest pain, denies weakness, denies loss of sensation  Protocols used: Leg Swelling and Edema-A-AH

## 2024-04-18 NOTE — Progress Notes (Addendum)
 Established Patient Office Visit  Subjective   Patient ID: Troy Gonzalez, male    DOB: Jan 03, 1956  Age: 68 y.o. MRN: [id number]  Chief Complaint  Patient presents with   Edema    Pt c/o swelling and soreness on L lower leg and ankle.  noticed it couple days ago.    HPI   Troy Gonzalez is seen today as a work in with left lower extremity swelling which he first noticed Wednesday.  Noticed edema in the leg calf foot and ankle region.  No history of similar edema in the past.  No known injury.  Very mild left calf discomfort.  No thigh pain.  Denies any recent dyspnea or chest pain.  Did fly last week to [place]  but came back Friday.  Did not notice edema till Wednesday.  No prior history of DVT or PE.  No known family history.  Takes no regular medications.  Non-smoker.  Past Medical History:  Diagnosis Date   History of chicken pox    Past Surgical History:  Procedure Laterality Date   CIRCUMCISION  09/22/2011   Procedure: CIRCUMCISION ADULT;  Surgeon: Thomasine Oiler, MD;  Location: [facility];  Service: Urology;  Laterality: N/A;    reports that he has never smoked. He has never used smokeless tobacco. He reports that he does not drink alcohol and does not use drugs. family history includes Asthma in his mother; Kidney disease in his sister; Stroke in his maternal grandmother. No Known Allergies  Review of Systems  Constitutional:  Negative for chills and fever.  Respiratory:  Negative for shortness of breath.   Cardiovascular:  Negative for chest pain.  Gastrointestinal:  Negative for abdominal pain.      Objective:     BP 118/80 (BP Location: Left Arm, Patient Position: Sitting, Cuff Size: Large)   Pulse 74   Temp 97.8 F (36.6 C) (Oral)   Ht 6' 1 (1.854 m)   Wt 209 lb 1.6 oz (94.8 kg)   SpO2 97%   BMI 27.59 kg/m  BP Readings from Last 3 Encounters:  04/18/24 118/80  03/21/24 133/78  12/10/23 138/76   Wt Readings from Last 3  Encounters:  04/18/24 209 lb 1.6 oz (94.8 kg)  12/10/23 216 lb 9.6 oz (98.2 kg)  10/29/23 218 lb 6.4 oz (99.1 kg)      Physical Exam Vitals reviewed.  Constitutional:      General: He is not in acute distress.    Appearance: He is not ill-appearing.  Cardiovascular:     Rate and Rhythm: Normal rate and regular rhythm.  Pulmonary:     Effort: Pulmonary effort is normal.     Breath sounds: Normal breath sounds. No wheezing or rales.  Musculoskeletal:     Comments: Has some obvious edema left lower extremity greater than right.  No reproducible calf tenderness.  Mild pitting edema left lower extremity and none on the right. No obvious left knee effusion.  No left knee popliteal swelling  Neurological:     Mental Status: He is alert.      No results found for any visits on 04/18/24.    The 10-year ASCVD risk score (Arnett DK, et al., 2019) is: 17.5%    Assessment & Plan:   Problem List Items Addressed This Visit   None Visit Diagnoses       Leg edema, left    -  Primary   Relevant Orders   VAS US  LOWER EXTREMITY VENOUS (  DVT)     Onset 2 days ago left lower extremity swelling with asymmetric swelling compared to the right.  Recent travel to [place] .  Mild left calf pain.  Rule out acute DVT.  Obtain stat Doppler left lower extremity. Patient knows to call 911 or go immediately to the ER for any chest pain or shortness of breath.  No follow-ups on file.    [name], MD  Positive venous doppler for DVT.  Will start Eliquis 10 mg po bid for 7 days and then 5 mg po bid and recommend follow up with primary next week to reassess.  Pt aware.    [name] MD [facility]

## 2024-04-18 NOTE — Telephone Encounter (Signed)
Patient was seen 7/25

## 2024-04-18 NOTE — Addendum Note (Signed)
 Addended by: MICHEAL WOLM ORN on: 04/18/2024 05:13 PM   Modules accepted: Orders

## 2024-04-18 NOTE — Patient Instructions (Signed)
 We are getting venous doppler to rule out deep vein thrombosis  Follow up immediately for any chest pain or shortness of breath.

## 2024-04-20 ENCOUNTER — Ambulatory Visit: Payer: Self-pay | Admitting: Family Medicine

## 2024-05-02 ENCOUNTER — Encounter: Payer: Self-pay | Admitting: Family Medicine

## 2024-05-02 ENCOUNTER — Ambulatory Visit: Payer: PRIVATE HEALTH INSURANCE | Admitting: Family Medicine

## 2024-05-02 VITALS — BP 124/70 | HR 80 | Temp 98.8°F | Ht 73.0 in | Wt 212.4 lb

## 2024-05-02 DIAGNOSIS — E119 Type 2 diabetes mellitus without complications: Secondary | ICD-10-CM

## 2024-05-02 DIAGNOSIS — I82402 Acute embolism and thrombosis of unspecified deep veins of left lower extremity: Secondary | ICD-10-CM | POA: Diagnosis not present

## 2024-05-02 LAB — POCT GLYCOSYLATED HEMOGLOBIN (HGB A1C): Hemoglobin A1C: 6.4 % — AB (ref 4.0–5.6)

## 2024-05-02 LAB — MICROALBUMIN / CREATININE URINE RATIO
Creatinine,U: 117.3 mg/dL
Microalb Creat Ratio: UNDETERMINED mg/g (ref 0.0–30.0)
Microalb, Ur: <0.7 mg/dL

## 2024-05-02 MED ORDER — APIXABAN 5 MG PO TABS: 5.0000 mg | ORAL_TABLET | Freq: Two times a day (BID) | ORAL | 0 refills | Status: DC

## 2024-05-02 NOTE — Patient Instructions (Addendum)
 Your hemoglobin A1c was 6.4% this visit.  This is great your diabetes is well-controlled.  Follow up in mid/end of October to discuss continuing anticoagulation versus stopping it.

## 2024-05-02 NOTE — Progress Notes (Signed)
 Established Patient Office Visit   Subjective  Patient ID: Troy Gonzalez, male    DOB: December 12, 1955  Age: 68 y.o. MRN: [id number]  Chief Complaint  Patient presents with   Medical Management of Chronic Issues    DVT follow-up     Pt is a 68 year old male seen for follow-up.  Patient seen in clinic on 04/18/2024 for LLE edema.  Ultrasound with DVT.  Started on Eliquis .  Denies prior history of DVT.  Patient denies pain in leg, SOB, fatigue.  Edema still present.  Occasionally has a tingling sensation in left lower ankle with standing in a certain way.  Pt driving to [facility] for a cruise at the end of August.  Patient has an eye exam scheduled for next month.    Patient Active Problem List   Diagnosis Date Noted   Diabetes mellitus without complication (HCC) 08/20/2019   Past Medical History:  Diagnosis Date   History of chicken pox    Past Surgical History:  Procedure Laterality Date   CIRCUMCISION  09/22/2011   Procedure: CIRCUMCISION ADULT;  Surgeon: Thomasine Oiler, MD;  Location: [facility];  Service: Urology;  Laterality: N/A;   Social History   Tobacco Use   Smoking status: Never   Smokeless tobacco: Never  Vaping Use   Vaping status: Never Used  Substance Use Topics   Alcohol use: No   Drug use: No   Family History  Problem Relation Age of Onset   Asthma Mother    Kidney disease Sister    Stroke Maternal Grandmother    No Known Allergies  ROS Negative unless stated above    Objective:     BP 124/70 (BP Location: Left Arm, Patient Position: Sitting, Cuff Size: Large)   Pulse 80   Temp 98.8 F (37.1 C) (Oral)   Ht 6' 1 (1.854 m)   Wt 212 lb 6.4 oz (96.3 kg)   SpO2 98%   BMI 28.02 kg/m  BP Readings from Last 3 Encounters:  05/02/24 124/70  04/18/24 118/80  03/21/24 133/78   Wt Readings from Last 3 Encounters:  05/02/24 212 lb 6.4 oz (96.3 kg)  04/18/24 209 lb 1.6 oz (94.8 kg)  12/10/23 216 lb 9.6 oz (98.2 kg)       Physical Exam Constitutional:      General: He is not in acute distress.    Appearance: Normal appearance.  HENT:     Head: Normocephalic and atraumatic.     Nose: Nose normal.     Mouth/Throat:     Mouth: Mucous membranes are moist.  Cardiovascular:     Rate and Rhythm: Normal rate and regular rhythm.     Heart sounds: Normal heart sounds. No murmur heard.    No gallop.     Comments: LLE non pitting edema to inferior knee. Pulmonary:     Effort: Pulmonary effort is normal. No respiratory distress.     Breath sounds: Normal breath sounds. No wheezing, rhonchi or rales.  Musculoskeletal:     Left lower leg: Edema present.  Skin:    General: Skin is warm and dry.  Neurological:     Mental Status: He is alert and oriented to person, place, and time.        05/02/2024    8:28 AM 12/10/2023    9:59 AM 10/29/2023   11:10 AM  Depression screen PHQ 2/9  Decreased Interest 0 0 0  Down, Depressed, Hopeless 0 0 0  PHQ -  2 Score 0 0 0  Altered sleeping 0 0 0  Tired, decreased energy 0 0 0  Change in appetite 0 0 0  Feeling bad or failure about yourself  0 0 0  Trouble concentrating 0 0 0  Moving slowly or fidgety/restless 0 0 0  Suicidal thoughts 0 0 0  PHQ-9 Score 0 0 0      05/02/2024    8:29 AM 12/10/2023    9:59 AM 10/29/2023   11:11 AM  GAD 7 : Generalized Anxiety Score  Nervous, Anxious, on Edge 0 0 0  Control/stop worrying 0 0 0  Worry too much - different things 0 0 0  Trouble relaxing 0 0 0  Restless 0 0 0  Easily annoyed or irritable 0 0 0  Afraid - awful might happen 0 0 0  Total GAD 7 Score 0 0 0     Results for orders placed or performed in visit on 05/02/24  POC HgB A1c  Result Value Ref Range   Hemoglobin A1C 6.4 (A) 4.0 - 5.6 %   HbA1c POC (<> result, manual entry)     HbA1c, POC (prediabetic range)     HbA1c, POC (controlled diabetic range)        Assessment & Plan:   Acute deep vein thrombosis (DVT) of left lower extremity, unspecified vein  (HCC) -     Apixaban ; Take 1 tablet (5 mg total) by mouth 2 (two) times daily.  Dispense: 180 tablet; Refill: 0  Diabetes mellitus without complication (HCC) -     POCT glycosylated hemoglobin (Hb A1C)  Patient with acute DVT per Doppler ultrasound 04/18/2024.  On Eliquis  starter pack.  90-day refill provided.  Discussed likely duration of treatment and option to discontinue in 3 mos versus continue indefinitely.  Will reevaluate in~ 10 weeks.  Given precautions.  Discussed supportive care including compression socks/TED hose, taking breaks if traveling, elevation of LE.  Your hemoglobin A1c was 6.4% this visit.  A1c previously 7.1% on 10/29/2023.  DM diet controlled.  Eye exam scheduled next month.  Return in about 11 weeks (around 07/18/2024) for DVT.   Clotilda JONELLE Single, MD

## 2024-05-08 ENCOUNTER — Other Ambulatory Visit: Payer: Self-pay | Admitting: Family Medicine

## 2024-05-12 ENCOUNTER — Ambulatory Visit: Payer: PRIVATE HEALTH INSURANCE | Admitting: Family Medicine

## 2024-06-18 DIAGNOSIS — H40023 Open angle with borderline findings, high risk, bilateral: Secondary | ICD-10-CM | POA: Diagnosis not present

## 2024-07-18 ENCOUNTER — Ambulatory Visit (INDEPENDENT_AMBULATORY_CARE_PROVIDER_SITE_OTHER): Payer: PRIVATE HEALTH INSURANCE | Admitting: Family Medicine

## 2024-07-18 ENCOUNTER — Encounter: Payer: Self-pay | Admitting: Family Medicine

## 2024-07-18 VITALS — BP 138/78 | HR 64 | Temp 98.5°F | Ht 73.0 in | Wt 212.8 lb

## 2024-07-18 DIAGNOSIS — I82402 Acute embolism and thrombosis of unspecified deep veins of left lower extremity: Secondary | ICD-10-CM

## 2024-07-18 NOTE — Progress Notes (Addendum)
 Established Patient Office Visit   Subjective  Patient ID: MAYO FAULK, male    DOB: [birth year]  Age: 68 y.o. MRN: [id number]  Chief Complaint  Patient presents with   Medical Management of Chronic Issues    Patient came in today for 11 week follow-up for DVT, left knee pain rate of pain 5 out of 10     Pt is a 68 yo male seen for f/u.  Pt noted to have an unprovoked DVT in LLE 11 wks ago.  Started on Eliquis .  Pt notes improvement in edema and pain of LLE, now 5/10 in L knee.  Occasionally has tingling in LLE with prolonged standing and climbing a ladder at work.  Pt denies SOB, CP, tachycardia, palpitations, changes in vision, HAs. Pt is a non smoker and does not drink.  Pt occasionally wearing compression socks.  No prior h/o DVT/PE.  Pt has a trip to [place]  for a cruise next wk.  Trip was postponed in May 25, 2024 2/2 the death of his mother in law.    Patient Active Problem List   Diagnosis Date Noted   Diabetes mellitus without complication (HCC) 08/20/2019   Past Medical History:  Diagnosis Date   History of chicken pox    Past Surgical History:  Procedure Laterality Date   CIRCUMCISION  09/22/2011   Procedure: CIRCUMCISION ADULT;  Surgeon: Thomasine Oiler, MD;  Location: [facility];  Service: Urology;  Laterality: N/A;   Social History   Tobacco Use   Smoking status: Never   Smokeless tobacco: Never  Vaping Use   Vaping status: Never Used  Substance Use Topics   Alcohol use: No   Drug use: No   Family History  Problem Relation Age of Onset   Asthma Mother    Kidney disease Sister    Stroke Maternal Grandmother    No Known Allergies  ROS Negative unless stated above    Objective:     BP 138/78 (BP Location: Right Arm, Patient Position: Sitting, Cuff Size: Normal)   Pulse 64   Temp 98.5 F (36.9 C) (Oral)   Ht 6' 1 (1.854 m)   Wt 212 lb 12.8 oz (96.5 kg)   SpO2 98%   BMI 28.08 kg/m  BP Readings from Last 3 Encounters:  07/18/24  138/78  05/02/24 124/70  04/18/24 118/80   Wt Readings from Last 3 Encounters:  07/18/24 212 lb 12.8 oz (96.5 kg)  05/02/24 212 lb 6.4 oz (96.3 kg)  04/18/24 209 lb 1.6 oz (94.8 kg)      Physical Exam Constitutional:      General: He is not in acute distress.    Appearance: Normal appearance.  HENT:     Head: Normocephalic and atraumatic.     Nose: Nose normal.     Mouth/Throat:     Mouth: Mucous membranes are moist.  Cardiovascular:     Rate and Rhythm: Normal rate and regular rhythm.     Heart sounds: Normal heart sounds. No murmur heard.    No gallop.     Comments: No edema in RLE.  Trace edema of LLE.  Negative Homans' sign. Pulmonary:     Effort: Pulmonary effort is normal. No respiratory distress.     Breath sounds: Normal breath sounds. No wheezing, rhonchi or rales.  Musculoskeletal:     Right lower leg: No edema.  Skin:    General: Skin is warm and dry.  Neurological:     Mental Status: He  is alert and oriented to person, place, and time.       07/18/2024    9:44 AM 05/02/2024    8:28 AM 12/10/2023    9:59 AM  Depression screen PHQ 2/9  Decreased Interest 0 0 0  Down, Depressed, Hopeless 0 0 0  PHQ - 2 Score 0 0 0  Altered sleeping 0 0 0  Tired, decreased energy 0 0 0  Change in appetite 0 0 0  Feeling bad or failure about yourself  0 0 0  Trouble concentrating 0 0 0  Moving slowly or fidgety/restless 0 0 0  Suicidal thoughts 0 0 0  PHQ-9 Score 0 0 0  Difficult doing work/chores Not difficult at all        07/18/2024    9:44 AM 05/02/2024    8:29 AM 12/10/2023    9:59 AM 10/29/2023   11:11 AM  GAD 7 : Generalized Anxiety Score  Nervous, Anxious, on Edge 0 0 0 0  Control/stop worrying 0 0 0 0  Worry too much - different things 0 0 0 0  Trouble relaxing 0 0 0 0  Restless 0 0 0 0  Easily annoyed or irritable 0 0 0 0  Afraid - awful might happen 0 0 0 0  Total GAD 7 Score 0 0 0 0  Anxiety Difficulty Not difficult at all        No results found  for any visits on 07/18/24.    Assessment & Plan:   Acute deep vein thrombosis (DVT) of left lower extremity, unspecified vein (HCC)  Doppler u/s 04/20/24 with acute deep vein thrombosis in L popliteal v, L posterior tibial veins, tibioperoneal confluence.  On Eliquis  x 3 months on 10/27.  Discussed r/b/a of stopping medication.  Advised of indefinite need for anticoagulation therapy for any reoccurrence.  Pt wishes to finish current prescription then stop medication.  Compression sock and ASA with travel.    Return in about 3 months (around 10/28/2024) for physical.   Clotilda JONELLE Single, MD

## 2024-08-07 ENCOUNTER — Other Ambulatory Visit: Payer: Self-pay | Admitting: Family Medicine

## 2024-08-07 DIAGNOSIS — I82402 Acute embolism and thrombosis of unspecified deep veins of left lower extremity: Secondary | ICD-10-CM

## 2024-09-24 ENCOUNTER — Ambulatory Visit (HOSPITAL_COMMUNITY)
Admission: EM | Admit: 2024-09-24 | Discharge: 2024-09-24 | Disposition: A | Discharge status: Home / Self Care | Source: Home / Self Care

## 2024-09-24 ENCOUNTER — Ambulatory Visit (HOSPITAL_COMMUNITY)

## 2024-09-24 ENCOUNTER — Encounter (HOSPITAL_COMMUNITY): Payer: Self-pay | Admitting: *Deleted

## 2024-09-24 DIAGNOSIS — R051 Acute cough: Secondary | ICD-10-CM | POA: Diagnosis not present

## 2024-09-24 DIAGNOSIS — J069 Acute upper respiratory infection, unspecified: Secondary | ICD-10-CM

## 2024-09-24 DIAGNOSIS — R059 Cough, unspecified: Secondary | ICD-10-CM | POA: Diagnosis not present

## 2024-09-24 DIAGNOSIS — R918 Other nonspecific abnormal finding of lung field: Secondary | ICD-10-CM | POA: Diagnosis not present

## 2024-09-24 DIAGNOSIS — R062 Wheezing: Secondary | ICD-10-CM | POA: Diagnosis not present

## 2024-09-24 HISTORY — DX: Acute embolism and thrombosis of unspecified deep veins of unspecified lower extremity: I82.409

## 2024-09-24 MED ORDER — PROMETHAZINE-DM 6.25-15 MG/5ML PO SYRP: 5.0000 mL | ORAL_SOLUTION | Freq: Every evening | ORAL | 0 refills | Status: AC | PRN

## 2024-09-24 MED ORDER — AZITHROMYCIN 250 MG PO TABS: 250.0000 mg | ORAL_TABLET | Freq: Every day | ORAL | 0 refills | Status: AC

## 2024-09-24 MED ORDER — ALBUTEROL SULFATE HFA 108 (90 BASE) MCG/ACT IN AERS: 1.0000 | INHALATION_SPRAY | Freq: Four times a day (QID) | RESPIRATORY_TRACT | 0 refills | Status: AC | PRN

## 2024-09-24 NOTE — Discharge Instructions (Signed)
 Upper Respiratory Infection (URI) -  Results  Chest X-ray is negative for pneumonia on my read. If the radiologist sees pneumonia present, I will call you.  What this is A URI affects the nose, throat, and upper airways. Cough and congestion can persist even when imaging is normal. Expected recovery Symptoms typically improve over 7-10 days, though cough may last longer and gradually resolve. Treatment plan Azithromycin  (Z-Pak) Take exactly as prescribed until completed. May cause nausea, diarrhea, or abdominal upset. Call if you develop rash, severe diarrhea, or palpitations. Guaifenesin  ER (Mucinex ) Helps thin and loosen mucus. Continue as directed with plenty of fluids. Promethazine  DM - bedtime Suppresses cough and improves sleep. Take at night only. May cause drowsiness or dizziness. Avoid alcohol and driving. Albuterol  inhaler or nebulizer - as needed Helps open airways for wheezing, chest tightness, or shortness of breath. Common side effects include jitteriness or fast heartbeat. Symptom management and home care Rest, hydrate well, and avoid smoke or irritants. Steam showers or a humidifier may help congestion. Use head elevation at night to reduce cough. Call your clinic if Fever persists or returns, symptoms worsen after initial improvement, increasing shortness of breath, or medication side effects occur. Go to the Emergency Department if Severe breathing difficulty, chest pain, confusion, bluish lips or face, or inability to keep fluids down.

## 2024-09-24 NOTE — ED Provider Notes (Signed)
 " [facility]    CSN: [id number] Arrival date & time: 09/24/24  9162      History   Chief Complaint Chief Complaint  Patient presents with   Cough    HPI Troy Gonzalez is a 68 y.o. male.   Troy Gonzalez presents today with cough that began 2 weeks ago and has worsened over the last couple of days.  He reports that his cough has been very dry and has been keeping him up at night.  He reports that he has had coughing fits that are difficult to control, and these cause shortness of breath.  He also reports that he has heard wheezing with coughing.  He reports mild intermittent low energy, but denies fever, chills, body aches, ear pain, sore throat, nausea, vomiting, and diarrhea.  He does report some mild rhinorrhea and nasal congestion.  He has been taking 12-hour Mucinex  for his symptoms which has provided some relief.  He denies a history of pulmonary disorders including asthma and COPD.  The history is provided by the patient. Language interpreter used: [id number] #[id number].  Cough Cough characteristics:  Dry Associated symptoms: rhinorrhea, shortness of breath and wheezing   Associated symptoms: no chills, no diaphoresis, no ear pain, no fever, no headaches, no myalgias and no sore throat     Past Medical History:  Diagnosis Date   DVT (deep venous thrombosis) (HCC)    left leg   History of chicken pox     Patient Active Problem List   Diagnosis Date Noted   Diabetes mellitus without complication (HCC) 08/20/2019    Past Surgical History:  Procedure Laterality Date   CIRCUMCISION  09/22/2011   Procedure: CIRCUMCISION ADULT;  Surgeon: Thomasine Oiler, MD;  Location: [facility];  Service: Urology;  Laterality: N/A;       Home Medications    Prior to Admission medications  Medication Sig Start Date End Date Taking? Authorizing Provider  albuterol  (VENTOLIN  HFA) 108 (90 Base) MCG/ACT inhaler Inhale 1-2 puffs into the lungs every 6 (six) hours as  needed for wheezing or shortness of breath. 09/24/24  Yes Leatrice Vernell HERO, NP  azithromycin  (ZITHROMAX ) 250 MG tablet Take 1 tablet (250 mg total) by mouth daily. Take first 2 tablets together, then 1 every day until finished. 09/24/24  Yes Leatrice Vernell HERO, NP  promethazine -dextromethorphan (PROMETHAZINE -DM) 6.25-15 MG/5ML syrup Take 5 mLs by mouth at bedtime as needed for cough. 09/24/24  Yes Leatrice Vernell HERO, NP  apixaban  (ELIQUIS ) 5 MG TABS tablet Take 2 tablets (10mg ) twice daily for 7 days, then 1 tablet (5mg ) twice daily Patient not taking: Reported on 09/24/2024 04/18/24   Micheal Wolm ORN, MD  ELIQUIS  5 MG TABS tablet TAKE 1 TABLET(5 MG) BY MOUTH TWICE DAILY Patient not taking: Reported on 09/24/2024 08/08/24   Mercer Clotilda SAUNDERS, MD    Family History Family History  Problem Relation Age of Onset   Asthma Mother    Kidney disease Sister    Stroke Maternal Grandmother     Social History Social History[1]   Allergies   Patient has no known allergies.   Review of Systems Review of Systems  Constitutional:  Positive for fatigue. Negative for activity change, appetite change, chills, diaphoresis and fever.  HENT:  Positive for congestion and rhinorrhea. Negative for ear pain, postnasal drip, sinus pressure, sinus pain, sneezing and sore throat.   Respiratory:  Positive for cough, shortness of breath and wheezing. Negative for chest tightness.  Gastrointestinal:  Negative for diarrhea, nausea and vomiting.  Musculoskeletal:  Negative for myalgias.  Neurological:  Negative for headaches.     Physical Exam Triage Vital Signs ED Triage Vitals  Encounter Vitals Group     BP 09/24/24 1100 (!) 146/81     Girls Systolic BP Percentile --      Girls Diastolic BP Percentile --      Boys Systolic BP Percentile --      Boys Diastolic BP Percentile --      Pulse Rate 09/24/24 1100 78     Resp 09/24/24 1100 18     Temp 09/24/24 1100 (!) 100.4 F (38 C)     Temp Source  09/24/24 1100 Oral     SpO2 09/24/24 1100 94 %     Weight --      Height --      Head Circumference --      Peak Flow --      Pain Score 09/24/24 1058 2     Pain Loc --      Pain Education --      Exclude from Growth Chart --    No data found.  Updated Vital Signs BP (!) 146/81 (BP Location: Left Arm)   Pulse 78   Temp (!) 100.4 F (38 C) (Oral)   Resp 18   SpO2 94%   Visual Acuity Right Eye Distance:   Left Eye Distance:   Bilateral Distance:    Right Eye Near:   Left Eye Near:    Bilateral Near:     Physical Exam Vitals and nursing note reviewed.  Constitutional:      General: He is not in acute distress.    Appearance: Normal appearance. He is normal weight. He is not toxic-appearing.  HENT:     Head: Normocephalic.     Right Ear: Tympanic membrane, ear canal and external ear normal. No middle ear effusion. There is no impacted cerumen. No mastoid tenderness. No hemotympanum. Tympanic membrane is not injected, erythematous or bulging.     Left Ear: Tympanic membrane, ear canal and external ear normal.  No middle ear effusion. There is no impacted cerumen. No mastoid tenderness. No hemotympanum. Tympanic membrane is not injected, erythematous or bulging.     Ears:     Comments: Serous fluid present behind bilat TM's     Nose: No congestion or rhinorrhea.     Right Sinus: No maxillary sinus tenderness or frontal sinus tenderness.     Left Sinus: No maxillary sinus tenderness or frontal sinus tenderness.     Mouth/Throat:     Mouth: Mucous membranes are moist.     Pharynx: Oropharynx is clear. No oropharyngeal exudate, posterior oropharyngeal erythema or postnasal drip.     Tonsils: No tonsillar exudate or tonsillar abscesses. 1+ on the right. 1+ on the left.  Eyes:     Conjunctiva/sclera: Conjunctivae normal.  Cardiovascular:     Rate and Rhythm: Normal rate and regular rhythm.     Heart sounds: Normal heart sounds.  Pulmonary:     Effort: Pulmonary effort is  normal. No respiratory distress.     Breath sounds: No stridor. Examination of the left-middle field reveals wheezing. Examination of the left-lower field reveals wheezing. Wheezing present. No rhonchi or rales.  Musculoskeletal:     Cervical back: Normal range of motion and neck supple.  Lymphadenopathy:     Cervical: No cervical adenopathy.     Right cervical: No posterior cervical adenopathy.  Left cervical: No posterior cervical adenopathy.  Skin:    General: Skin is warm and dry.  Neurological:     Mental Status: He is alert and oriented to person, place, and time.  Psychiatric:        Mood and Affect: Mood normal.        Behavior: Behavior normal.      UC Treatments / Results  Labs (all labs ordered are listed, but only abnormal results are displayed) Labs Reviewed - No data to display  EKG   Radiology DG Chest 2 View Result Date: 09/24/2024 EXAM: 2 VIEW(S) XRAY OF THE CHEST 09/24/2024 11:40:04 AM COMPARISON: 03/21/2024 CLINICAL HISTORY: Cough 2 weeks, worsening. Focal wheezing in LLL. 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 Possible bronchiectasis identified within the lower lung zones. No pleural effusion. No pneumothorax. HEART AND MEDIASTINUM: No acute abnormality of the cardiac and mediastinal silhouettes. BONES AND SOFT TISSUES: No acute osseous abnormality. IMPRESSION: 1. No acute cardiopulmonary abnormality. 2. Chronic peripheral predominant reticular interstitial opacities within both lungs, consistent with underlying fibrotic interstitial lung disease. Consider further evaluation with follow-up, nonemergent high-resolution CT of the chest for more definitive characterization. 3. Possible bronchiectasis in the lower lung zones. Electronically signed by: Waddell Calk MD 09/24/2024 12:44 PM EST RP Workstation:  [id number]    Procedures Procedures (including critical care time)  Medications Ordered in UC Medications - No data to display  Initial Impression / Assessment and Plan / UC Course  I have reviewed the triage vital signs and the nursing notes.  Pertinent labs & imaging results that were available during my care of the patient were reviewed by me and considered in my medical decision making (see chart for details).     Bacterial URI Chest x-ray negative for pneumonia.  Given worsening cough for 2 weeks and fever on exam today, am treating for bacterial upper respiratory infection with Z-Pak.  He will continue guaifenesin  1200 mg twice daily.  He may take Promethazine  DM before bed as needed for cough.  Given wheezing in left lower lung, and albuterol  inhaler was prescribed today with verbal instructions on how to use.  He may take this every 6 hours as needed for shortness of breath and wheezing.  He will return for persistent fevers, worsening shortness of breath or wheezing, or onset of new symptoms Final Clinical Impressions(s) / UC Diagnoses   Final diagnoses:  Acute cough  Acute upper respiratory infection     Discharge Instructions      Upper Respiratory Infection (URI) -  Results  Chest X-ray is negative for pneumonia on my read. If the radiologist sees pneumonia present, I will call you.  What this is A URI affects the nose, throat, and upper airways. Cough and congestion can persist even when imaging is normal. Expected recovery Symptoms typically improve over 7-10 days, though cough may last longer and gradually resolve. Treatment plan Azithromycin  (Z-Pak) Take exactly as prescribed until completed. May cause nausea, diarrhea, or abdominal upset. Call if you develop rash, severe diarrhea, or palpitations. Guaifenesin  ER (Mucinex ) Helps thin and loosen mucus. Continue as directed with plenty of fluids. Promethazine  DM - bedtime Suppresses cough and improves  sleep. Take at night only. May cause drowsiness or dizziness. Avoid alcohol and driving. Albuterol  inhaler or nebulizer - as needed Helps open airways for wheezing, chest tightness, or shortness of breath. Common side effects include jitteriness  or fast heartbeat. Symptom management and home care Rest, hydrate well, and avoid smoke or irritants. Steam showers or a humidifier may help congestion. Use head elevation at night to reduce cough. Call your clinic if Fever persists or returns, symptoms worsen after initial improvement, increasing shortness of breath, or medication side effects occur. Go to the Emergency Department if Severe breathing difficulty, chest pain, confusion, bluish lips or face, or inability to keep fluids down.     ED Prescriptions     Medication Sig Dispense Auth. Provider   albuterol  (VENTOLIN  HFA) 108 (90 Base) MCG/ACT inhaler Inhale 1-2 puffs into the lungs every 6 (six) hours as needed for wheezing or shortness of breath. 8 g Leatrice Vernell HERO, NP   promethazine -dextromethorphan (PROMETHAZINE -DM) 6.25-15 MG/5ML syrup Take 5 mLs by mouth at bedtime as needed for cough. 118 mL Leatrice Vernell HERO, NP   azithromycin  (ZITHROMAX ) 250 MG tablet Take 1 tablet (250 mg total) by mouth daily. Take first 2 tablets together, then 1 every day until finished. 6 tablet Leatrice Vernell HERO, NP      PDMP not reviewed this encounter.    [1]  Social History Tobacco Use   Smoking status: Never   Smokeless tobacco: Never  Vaping Use   Vaping status: Never Used  Substance Use Topics   Alcohol use: No   Drug use: No     Leatrice Vernell HERO, NP 09/24/24 1253  "

## 2024-09-24 NOTE — ED Triage Notes (Signed)
 Pt reports bad cough with a lot of phlegm since before Christmas. Taking Mucinex . This is the first time he has been seen since getting sick. Denies known fever.

## 2024-09-25 ENCOUNTER — Ambulatory Visit (HOSPITAL_COMMUNITY): Payer: Self-pay

## 2024-10-02 ENCOUNTER — Telehealth: Payer: Self-pay

## 2024-10-02 NOTE — Telephone Encounter (Signed)
 Copied from CRM [id number]. Topic: General - Other >> Oct 02, 2024  2:26 PM Fonda T wrote: Reason for CRM: Pt states he was seen at [facility] for work physical.  States he is needing a note from provider to states he is no longer taking medication for blood clot in left leg.  States letter is needed for completion of work physical.  Pt states he would like to be contacted once letter is ready to be picked up.  CB# [phone]

## 2024-10-07 ENCOUNTER — Telehealth: Payer: Self-pay

## 2024-10-07 NOTE — Telephone Encounter (Signed)
 Letter has been printed and is at the front desk ready for pickup

## 2024-10-07 NOTE — Telephone Encounter (Signed)
 See other note

## 2024-10-08 ENCOUNTER — Telehealth: Payer: Self-pay

## 2024-10-08 NOTE — Telephone Encounter (Signed)
 Copied from CRM [id number]. Topic: General - Other >> Oct 02, 2024  2:26 PM Fonda T wrote: Reason for CRM: Pt states he was seen at [facility] for work physical.  States he is needing a note from provider to states he is no longer taking medication for blood clot in left leg.  States letter is needed for completion of work physical.  Pt states he would like to be contacted once letter is ready to be picked up.  CB# [phone] >> Oct 08, 2024 11:49 AM Mia F wrote: Pt says the letter that was given need to be more detailed. He says that the letter need to state that it is safe for him to drive. They also want to make sure he is no longer taking the medication. Overall, the letter need to be more in depth. [phone]  Once done please fax to [phone] or Email: [email or web address]

## 2024-10-10 NOTE — Telephone Encounter (Signed)
Letter has been Faxed.
